# Patient Record
Sex: Female | Born: 1995 | Race: Asian | Hispanic: No | Marital: Married | Smoking: Never smoker
Health system: Southern US, Community
[De-identification: ages and names within clinical notes are randomized; demographics above are authoritative.]

---

## 2020-04-26 ENCOUNTER — Encounter: Payer: Self-pay | Admitting: Obstetrics & Gynecology

## 2020-04-26 ENCOUNTER — Other Ambulatory Visit (HOSPITAL_COMMUNITY)
Admission: RE | Admit: 2020-04-26 | Discharge: 2020-04-26 | Disposition: A | Discharge status: Home / Self Care | Payer: Medicaid Other | Source: Ambulatory Visit | Attending: Obstetrics & Gynecology | Admitting: Obstetrics & Gynecology

## 2020-04-26 ENCOUNTER — Other Ambulatory Visit: Payer: Self-pay

## 2020-04-26 ENCOUNTER — Ambulatory Visit (INDEPENDENT_AMBULATORY_CARE_PROVIDER_SITE_OTHER): Payer: Self-pay | Admitting: Obstetrics & Gynecology

## 2020-04-26 VITALS — BP 107/60 | HR 85 | Ht 62.99 in | Wt 112.9 lb

## 2020-04-26 DIAGNOSIS — Z3A33 33 weeks gestation of pregnancy: Secondary | ICD-10-CM

## 2020-04-26 DIAGNOSIS — O0990 Supervision of high risk pregnancy, unspecified, unspecified trimester: Secondary | ICD-10-CM | POA: Insufficient documentation

## 2020-04-26 DIAGNOSIS — O34219 Maternal care for unspecified type scar from previous cesarean delivery: Secondary | ICD-10-CM

## 2020-04-26 DIAGNOSIS — Z3493 Encounter for supervision of normal pregnancy, unspecified, third trimester: Secondary | ICD-10-CM | POA: Insufficient documentation

## 2020-04-26 DIAGNOSIS — Z0289 Encounter for other administrative examinations: Secondary | ICD-10-CM | POA: Insufficient documentation

## 2020-04-26 NOTE — Patient Instructions (Addendum)
Return to office for any scheduled appointments. Call the office or go to the MAU at [facility] if:  You begin to have strong, frequent contractions  Your water breaks.  Sometimes it is a big gush of fluid, sometimes it is just a trickle that keeps getting your panties wet or running down your legs  You have vaginal bleeding.  It is normal to have a small amount of spotting if your cervix was checked.   You do not feel your baby moving like normal.  If you do not, get something to eat and drink and lay down and focus on feeling your baby move.   If your baby is still not moving like normal, you should call the office or go to MAU.  Any other obstetric concerns.   Third Trimester of Pregnancy The third trimester is from week 28 through week 40 (months 7 through 9). The third trimester is a time when the unborn baby (fetus) is growing rapidly. At the end of the ninth month, the fetus is about 20 inches in length and weighs 6-10 pounds. Body changes during your third trimester Your body will continue to go through many changes during pregnancy. The changes vary from woman to woman. During the third trimester:  Your weight will continue to increase. You can expect to gain 25-35 pounds (11-16 kg) by the end of the pregnancy.  You may begin to get stretch marks on your hips, abdomen, and breasts.  You may urinate more often because the fetus is moving lower into your pelvis and pressing on your bladder.  You may develop or continue to have heartburn. This is caused by increased hormones that slow down muscles in the digestive tract.  You may develop or continue to have constipation because increased hormones slow digestion and cause the muscles that push waste through your intestines to relax.  You may develop hemorrhoids. These are swollen veins (varicose veins) in the rectum that can itch or be painful.  You may develop swollen, bulging veins (varicose veins)  in your legs.  You may have increased body aches in the pelvis, back, or thighs. This is due to weight gain and increased hormones that are relaxing your joints.  You may have changes in your hair. These can include thickening of your hair, rapid growth, and changes in texture. Some women also have hair loss during or after pregnancy, or hair that feels dry or thin. Your hair will most likely return to normal after your baby is born.  Your breasts will continue to grow and they will continue to become tender. A yellow fluid (colostrum) may leak from your breasts. This is the first milk you are producing for your baby.  Your belly button may stick out.  You may notice more swelling in your hands, face, or ankles.  You may have increased tingling or numbness in your hands, arms, and legs. The skin on your belly may also feel numb.  You may feel short of breath because of your expanding uterus.  You may have more problems sleeping. This can be caused by the size of your belly, increased need to urinate, and an increase in your body's metabolism.  You may notice the fetus "dropping," or moving lower in your abdomen (lightening).  You may have increased vaginal discharge.  You may notice your joints feel loose and you may have pain around your pelvic bone. What to expect at prenatal visits You will have   prenatal exams every 2 weeks until week 36. Then you will have weekly prenatal exams. During a routine prenatal visit:  You will be weighed to make sure you and the baby are growing normally.  Your blood pressure will be taken.  Your abdomen will be measured to track your baby's growth.  The fetal heartbeat will be listened to.  Any test results from the previous visit will be discussed.  You may have a cervical check near your due date to see if your cervix has softened or thinned (effaced).  You will be tested for Group B streptococcus. This happens between 35 and 37 weeks. Your  health care provider may ask you:  What your birth plan is.  How you are feeling.  If you are feeling the baby move.  If you have had any abnormal symptoms, such as leaking fluid, bleeding, severe headaches, or abdominal cramping.  If you are using any tobacco products, including cigarettes, chewing tobacco, and electronic cigarettes.  If you have any questions. Other tests or screenings that may be performed during your third trimester include:  Blood tests that check for low iron levels (anemia).  Fetal testing to check the health, activity level, and growth of the fetus. Testing is done if you have certain medical conditions or if there are problems during the pregnancy.  Nonstress test (NST). This test checks the health of your baby to make sure there are no signs of problems, such as the baby not getting enough oxygen. During this test, a belt is placed around your belly. The baby is made to move, and its heart rate is monitored during movement. What is false labor? False labor is a condition in which you feel small, irregular tightenings of the muscles in the womb (contractions) that usually go away with rest, changing position, or drinking water. These are called Braxton Hicks contractions. Contractions may last for hours, days, or even weeks before true labor sets in. If contractions come at regular intervals, become more frequent, increase in intensity, or become painful, you should see your health care provider. What are the signs of labor?  Abdominal cramps.  Regular contractions that start at 10 minutes apart and become stronger and more frequent with time.  Contractions that start on the top of the uterus and spread down to the lower abdomen and back.  Increased pelvic pressure and dull back pain.  A watery or bloody mucus discharge that comes from the vagina.  Leaking of amniotic fluid. This is also known as your "water breaking." It could be a slow trickle or a gush.  Let your health care provider know if it has a color or strange odor. If you have any of these signs, call your health care provider right away, even if it is before your due date. Follow these instructions at home: Medicines  Follow your health care provider's instructions regarding medicine use. Specific medicines may be either safe or unsafe to take during pregnancy.  Take a prenatal vitamin that contains at least 600 micrograms (mcg) of folic acid.  If you develop constipation, try taking a stool softener if your health care provider approves. Eating and drinking   Eat a balanced diet that includes fresh fruits and vegetables, whole grains, good sources of protein such as meat, eggs, or tofu, and low-fat dairy. Your health care provider will help you determine the amount of weight gain that is right for you.  Avoid raw meat and uncooked cheese. These carry germs that   can cause birth defects in the baby.  If you have low calcium intake from food, talk to your health care provider about whether you should take a daily calcium supplement.  Eat four or five small meals rather than three large meals a day.  Limit foods that are high in fat and processed sugars, such as fried and sweet foods.  To prevent constipation: ? Drink enough fluid to keep your urine clear or pale yellow. ? Eat foods that are high in fiber, such as fresh fruits and vegetables, whole grains, and beans. Activity  Exercise only as directed by your health care provider. Most women can continue their usual exercise routine during pregnancy. Try to exercise for 30 minutes at least 5 days a week. Stop exercising if you experience uterine contractions.  Avoid heavy lifting.  Do not exercise in extreme heat or humidity, or at high altitudes.  Wear low-heel, comfortable shoes.  Practice good posture.  You may continue to have sex unless your health care provider tells you otherwise. Relieving pain and  discomfort  Take frequent breaks and rest with your legs elevated if you have leg cramps or low back pain.  Take warm sitz baths to soothe any pain or discomfort caused by hemorrhoids. Use hemorrhoid cream if your health care provider approves.  Wear a good support bra to prevent discomfort from breast tenderness.  If you develop varicose veins: ? Wear support pantyhose or compression stockings as told by your healthcare provider. ? Elevate your feet for 15 minutes, 3-4 times a day. Prenatal care  Write down your questions. Take them to your prenatal visits.  Keep all your prenatal visits as told by your health care provider. This is important. Safety  Wear your seat belt at all times when driving.  Make a list of emergency phone numbers, including numbers for family, friends, the hospital, and police and fire departments. General instructions  Avoid cat litter boxes and soil used by cats. These carry germs that can cause birth defects in the baby. If you have a cat, ask someone to clean the litter box for you.  Do not travel far distances unless it is absolutely necessary and only with the approval of your health care provider.  Do not use hot tubs, steam rooms, or saunas.  Do not drink alcohol.  Do not use any products that contain nicotine or tobacco, such as cigarettes and e-cigarettes. If you need help quitting, ask your health care provider.  Do not use any medicinal herbs or unprescribed drugs. These chemicals affect the formation and growth of the baby.  Do not douche or use tampons or scented sanitary pads.  Do not cross your legs for long periods of time.  To prepare for the arrival of your baby: ? Take prenatal classes to understand, practice, and ask questions about labor and delivery. ? Make a trial run to the hospital. ? Visit the hospital and tour the maternity area. ? Arrange for maternity or paternity leave through employers. ? Arrange for family and  friends to take care of pets while you are in the hospital. ? Purchase a rear-facing car seat and make sure you know how to install it in your car. ? Pack your hospital bag. ? Prepare the baby's nursery. Make sure to remove all pillows and stuffed animals from the baby's crib to prevent suffocation.  Visit your dentist if you have not gone during your pregnancy. Use a soft toothbrush to brush your teeth and be   gentle when you floss. Contact a health care provider if:  You are unsure if you are in labor or if your water has broken.  You become dizzy.  You have mild pelvic cramps, pelvic pressure, or nagging pain in your abdominal area.  You have lower back pain.  You have persistent nausea, vomiting, or diarrhea.  You have an unusual or bad smelling vaginal discharge.  You have pain when you urinate. Get help right away if:  Your water breaks before 37 weeks.  You have regular contractions less than 5 minutes apart before 37 weeks.  You have a fever.  You are leaking fluid from your vagina.  You have spotting or bleeding from your vagina.  You have severe abdominal pain or cramping.  You have rapid weight loss or weight gain.  You have shortness of breath with chest pain.  You notice sudden or extreme swelling of your face, hands, ankles, feet, or legs.  Your baby makes fewer than 10 movements in 2 hours.  You have severe headaches that do not go away when you take medicine.  You have vision changes. Summary  The third trimester is from week 28 through week 40, months 7 through 9. The third trimester is a time when the unborn baby (fetus) is growing rapidly.  During the third trimester, your discomfort may increase as you and your baby continue to gain weight. You may have abdominal, leg, and back pain, sleeping problems, and an increased need to urinate.  During the third trimester your breasts will keep growing and they will continue to become tender. A yellow  fluid (colostrum) may leak from your breasts. This is the first milk you are producing for your baby.  False labor is a condition in which you feel small, irregular tightenings of the muscles in the womb (contractions) that eventually go away. These are called Braxton Hicks contractions. Contractions may last for hours, days, or even weeks before true labor sets in.  Signs of labor can include: abdominal cramps; regular contractions that start at 10 minutes apart and become stronger and more frequent with time; watery or bloody mucus discharge that comes from the vagina; increased pelvic pressure and dull back pain; and leaking of amniotic fluid. This information is not intended to replace advice given to you by your health care provider. Make sure you discuss any questions you have with your health care provider. Document Revised: 09/22/2018 Document Reviewed: 07/07/2016 Elsevier Patient Education  2020 Elsevier Inc.   Vaginal Birth After Cesarean Delivery  Vaginal birth after cesarean delivery (VBAC) is giving birth vaginally after previously delivering a baby through a cesarean section (C-section). A VBAC may be a safe option for you, depending on your health and other factors. It is important to discuss VBAC with your health care provider early in your pregnancy so you can understand the risks, benefits, and options. Having these discussions early will give you time to make your birth plan. Who are the best candidates for VBAC? The best candidates for VBAC are women who:  Have had one or two prior cesarean deliveries, and the incision made during the delivery was horizontal (low transverse).  Do not have a vertical (classical) scar on their uterus.  Have not had a tear in the wall of their uterus (uterine rupture).  Plan to have more pregnancies. A VBAC is also more likely to be successful:  In women who have previously given birth vaginally.  When labor starts by itself  (spontaneously)   before the due date. What are the benefits of VBAC? The benefits of delivering your baby vaginally instead of by a cesarean delivery include:  A shorter hospital stay.  A faster recovery time.  Less pain.  Avoiding risks associated with major surgery, such as infection and blood clots.  Less blood loss and less need for donated blood (transfusions). What are the risks of VBAC? The main risk of attempting a VBAC is that it may fail, forcing your health care provider to deliver your baby by a C-section. Other risks are rare and include:  Tearing (rupture) of the scar from a past cesarean delivery.  Other risks associated with vaginal deliveries. If a repeat cesarean delivery is needed, the risks include:  Blood loss.  Infection.  Blood clot.  Damage to surrounding organs.  Removal of the uterus (hysterectomy), if it is damaged.  Placenta problems in future pregnancies. What else should I know about my options? Delivering a baby through a VBAC is similar to having a normal spontaneous vaginal delivery. Therefore, it is safe:  To try with twins.  For your health care provider to try to turn the baby from a breech position (external cephalic version) during labor.  With epidural analgesia for pain relief. Consider where you would like to deliver your baby. VBAC should be attempted in facilities where an emergency cesarean delivery can be performed. VBAC is not recommended for home births. Any changes in your health or your baby's health during your pregnancy may make it necessary to change your initial decision about VBAC. Your health care provider may recommend that you do not attempt a VBAC if:  Your baby's suspected weight is 8.8 lb (4 kg) or more.  You have preeclampsia. This is a condition that causes high blood pressure along with other symptoms, such as swelling and headaches.  You will have VBAC less than 19 months after your cesarean  delivery.  You are past your due date.  You need to have labor started (induced) because your cervix is not ready for labor (unfavorable). Where to find more information  American Pregnancy Association: americanpregnancy.org  [employer]: acog.org Summary  Vaginal birth after cesarean delivery (VBAC) is giving birth vaginally after previously delivering a baby through a cesarean section (C-section). A VBAC may be a safe option for you, depending on your health and other factors.  Discuss VBAC with your health care provider early in your pregnancy so you can understand the risks, benefits, options, and have plenty of time to make your birth plan.  The main risk of attempting a VBAC is that it may fail, forcing your health care provider to deliver your baby by a C-section. Other risks are rare. This information is not intended to replace advice given to you by your health care provider. Make sure you discuss any questions you have with your health care provider. Document Revised: 09/27/2018 Document Reviewed: 09/08/2016 Elsevier Patient Education  2020 [employer].

## 2020-04-26 NOTE — Progress Notes (Signed)
History:   Katriona Schmierer is a 24 y.o. G2P1001 at [redacted]w[redacted]d by limited ultrasound dating being seen today for her first obstetrical visit here in the [place].  She is a refugee from [place], arrived last month. 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 No care in [place].  [place] interpreter used for encounter. She does remember she had a cesarean section, unknown reason, for her delivery in 2019. She wants to try to have a vaginal delivery this time.  Denied any medical history.  Throughout encounter, patient was very shy and could not meet my eyes. Did not want to be examined. Patient does intend to breast feed. Pregnancy history fully reviewed. [place], limited ultrasound was done that gave her this dating and EDD of 06/13/20 with limited biometry. She also received the COVID and Tdap vaccines there.  Patient wants female providers only, her appointment had to be switched to me from another female colleague.  Patient reports no complaints.      HISTORY: OB History  Gravida Para Term Preterm AB Living  2 1 1  0 0 1  SAB TAB Ectopic Multiple Live Births  0 0 0 0 1    # Outcome Date GA Lbr Len/2nd Weight Sex Delivery Anes PTL Lv  2 Current           1 Term 2019     CS-LTranv      History reviewed. No pertinent past medical history. History reviewed. No pertinent surgical history. History reviewed. No pertinent family history.   Social History   Tobacco Use  . Smoking status: Never Smoker  . Smokeless tobacco: Never Used  Vaping Use  . Vaping Use: Never used  Substance Use Topics  . Alcohol use: Never  . Drug use: Never   No Known Allergies No current outpatient medications on file prior to visit.   No current facility-administered medications on file prior to visit.    Review of Systems Pertinent items noted in HPI and remainder of comprehensive ROS otherwise negative. Physical Exam:   Vitals:   04/26/20 0846 04/26/20 0857  BP:  107/60   Pulse: 85   Weight: 112 lb 14.4 oz (51.2 kg)   Height:  5' 2.99" (1.6 m)   Fetal Heart Rate (bpm): 142 bpm  General: well-developed, well-nourished female in no acute distress  Breasts:  normal appearance, no masses or tenderness bilaterally  Skin: normal coloration and turgor, no rashes  Neurologic: oriented, normal, negative, normal mood  Extremities: normal strength, tone, and muscle mass, ROM of all joints is normal  HEENT PERRLA, extraocular movement intact and sclera clear, anicteric  Neck supple and no masses  Cardiovascular: regular rate noted  Respiratory:  no respiratory distress, normal breath sounds  Abdomen: Gravid, nontender on RN exam during FHR check.     Assessment:    Pregnancy: G2P1001 Patient Active Problem List   Diagnosis Date Noted  . Supervision of low-risk pregnancy, third trimester 04/26/2020  . Previous cesarean delivery, antepartum 04/26/2020  . Refugee status 04/26/2020     Plan:    1. Previous cesarean delivery, antepartum Counseled extensively regarding TOLAC vs RCS; risks/benefits discussed in detail. All questions answered.  Patient elects for TOLAC, consent signed 04/26/2020. Since she is unable to write, this was signed by interpreter and witnessed by second provider (Dr. 13/05/2020).  2. Refugee status Language, cultural and educational barrier. She also desires female providers.  Discussed with patient that this is a teaching  facility and that during their pregnancy there may be female physicians and other healthcare providers involved in their care. This includes, but is not limited to, prenatal visits and ultrasound examinations, as well as, the labor and delivery process and postpartum care. Also discussed with patient that they do have the right to transfer their care to another practice in the event that they do not agree or wish to see female providers under any situation. Informed patient that we will make every attempt to have a female  provider care for them, though this cannot be guaranteed, such as in an emergent situation. Also, reminded patient that when they are scheduling their appointments, to request a female provider each time and we will try to accommodate their request.   3. [redacted] weeks gestation of pregnancy 4. Encounter for supervision of low-risk pregnancy in third trimester - CBC/D/Plt+RPR+Rh+ABO+Rub Ab... - Hemoglobin A1c - GC/Chlamydia probe amp ([place])not at [facility] - Culture, OB Urine - Comprehensive metabolic panel - [place] MFM OB COMP + 14 WK; Future Initial labs drawn.  Declines prenatal vitamins. Problem list reviewed and updated. Ultrasound discussed; fetal anatomic survey: ordered. Anticipatory guidance about prenatal visits given including labs, ultrasounds, and testing. The nature of [place] [facility] with multiple MDs and Advanced Practice Providers was explained to patient; also emphasized that residents, students are part of our team. Routine obstetric precautions reviewed. Encouraged to seek out care at office or emergency room [facility] MAU preferred) for urgent and/or emergent concerns. Return in about 1 week (around 05/03/2020) for 2 hr GTT   2 weeks:  OFFICE OB Visit (Low Risk).     Jaynie Collins, MD, FACOG Obstetrician & Gynecologist, [employer] [facility] [employer], [facility]

## 2020-04-28 LAB — URINE CULTURE, OB REFLEX

## 2020-04-28 LAB — CULTURE, OB URINE

## 2020-04-29 DIAGNOSIS — Z3493 Encounter for supervision of normal pregnancy, unspecified, third trimester: Secondary | ICD-10-CM

## 2020-04-29 LAB — COMPREHENSIVE METABOLIC PANEL
ALT: 22 IU/L (ref 0–32)
AST: 20 IU/L (ref 0–40)
Albumin/Globulin Ratio: 1.3 (ref 1.2–2.2)
Albumin: 3.7 g/dL — ABNORMAL LOW (ref 3.9–5.0)
Alkaline Phosphatase: 135 IU/L — ABNORMAL HIGH (ref 44–121)
BUN/Creatinine Ratio: 13 (ref 9–23)
BUN: 7 mg/dL (ref 6–20)
Bilirubin Total: <0.2 mg/dL (ref 0.0–1.2)
CO2: 18 mmol/L — ABNORMAL LOW (ref 20–29)
Calcium: 8.6 mg/dL — ABNORMAL LOW (ref 8.7–10.2)
Chloride: 106 mmol/L (ref 96–106)
Creatinine, Ser: 0.52 mg/dL — ABNORMAL LOW (ref 0.57–1.00)
GFR calc Af Amer: 152 mL/min/{1.73_m2} (ref >59)
GFR calc non Af Amer: 132 mL/min/{1.73_m2} (ref >59)
Globulin, Total: 2.8 g/dL (ref 1.5–4.5)
Glucose: 78 mg/dL (ref 65–99)
Potassium: 4.2 mmol/L (ref 3.5–5.2)
Sodium: 138 mmol/L (ref 134–144)
Total Protein: 6.5 g/dL (ref 6.0–8.5)

## 2020-04-29 LAB — CBC/D/PLT+RPR+RH+ABO+RUB AB...
Antibody Screen: NEGATIVE
Basophils Absolute: 0 10*3/uL (ref 0.0–0.2)
Basos: 0 %
EOS (ABSOLUTE): 0.1 10*3/uL (ref 0.0–0.4)
Eos: 2 %
HCV Ab: <0.1 s/co ratio (ref 0.0–0.9)
HIV Screen 4th Generation wRfx: NONREACTIVE
Hematocrit: 33.4 % — ABNORMAL LOW (ref 34.0–46.6)
Hemoglobin: 11.3 g/dL (ref 11.1–15.9)
Hepatitis B Surface Ag: NEGATIVE
Immature Grans (Abs): 0 10*3/uL (ref 0.0–0.1)
Immature Granulocytes: 0 %
Lymphocytes Absolute: 1.9 10*3/uL (ref 0.7–3.1)
Lymphs: 21 %
MCH: 30.1 pg (ref 26.6–33.0)
MCHC: 33.8 g/dL (ref 31.5–35.7)
MCV: 89 fL (ref 79–97)
Monocytes Absolute: 0.6 10*3/uL (ref 0.1–0.9)
Monocytes: 7 %
Neutrophils Absolute: 6.3 10*3/uL (ref 1.4–7.0)
Neutrophils: 70 %
Platelets: 288 10*3/uL (ref 150–450)
RBC: 3.75 x10E6/uL — ABNORMAL LOW (ref 3.77–5.28)
RDW: 12.2 % (ref 11.7–15.4)
RPR Ser Ql: NONREACTIVE
Rh Factor: POSITIVE
Rubella Antibodies, IGG: 4.85 index (ref >0.99)
WBC: 9 10*3/uL (ref 3.4–10.8)

## 2020-04-29 LAB — GC/CHLAMYDIA PROBE AMP (~~LOC~~) NOT AT ARMC
Chlamydia: NEGATIVE
Comment: NEGATIVE
Comment: NORMAL
Neisseria Gonorrhea: NEGATIVE

## 2020-04-29 LAB — HEMOGLOBIN A1C
Est. average glucose Bld gHb Est-mCnc: 82 mg/dL
Hgb A1c MFr Bld: 4.5 % — ABNORMAL LOW (ref 4.8–5.6)

## 2020-04-29 LAB — HCV INTERPRETATION

## 2020-05-01 ENCOUNTER — Encounter: Payer: Self-pay | Admitting: *Deleted

## 2020-05-03 ENCOUNTER — Other Ambulatory Visit (INDEPENDENT_AMBULATORY_CARE_PROVIDER_SITE_OTHER): Payer: Self-pay | Admitting: Medical

## 2020-05-03 ENCOUNTER — Other Ambulatory Visit: Payer: Self-pay

## 2020-05-03 ENCOUNTER — Encounter: Payer: Self-pay | Admitting: Medical

## 2020-05-03 DIAGNOSIS — Z3493 Encounter for supervision of normal pregnancy, unspecified, third trimester: Secondary | ICD-10-CM

## 2020-05-03 NOTE — Progress Notes (Signed)
   PRENATAL VISIT NOTE  Subjective:  Erika Humphrey is a 24 y.o. G2P1001 at [redacted]w[redacted]d being seen today for ongoing prenatal care.  She is currently monitored for the following issues for this low-risk pregnancy and has Supervision of low-risk pregnancy, third trimester; Previous cesarean delivery, antepartum; and Refugee status on their problem list.  Patient reports occasional contractions.   .  .   . Denies leaking of fluid. Patient complains of intermittent pain that occurs 1-2x/day. She states this is "normal" pain. She denies vaginal bleeding, discharge, LOF. She reports normal fetal movement.   The following portions of the patient's history were reviewed and updated as appropriate: allergies, current medications, past family history, past medical history, past social history, past surgical history and problem list.   Objective:   Vitals:   05/03/20 0940  BP: (!) 84/47  Pulse: 77    Fetal Status:          FHR 136-142 bpm  General:  Alert, oriented and cooperative. Patient is in no acute distress.  Skin: Skin is warm and dry. No rash noted.   Cardiovascular: Normal heart rate noted  Respiratory: Normal respiratory effort, no problems with respiration noted  Abdomen: Soft, gravid, appropriate for gestational age.        Pelvic: Cervical exam deferred        Extremities: Normal range of motion.     Mental Status: Normal mood and affect. Normal behavior. Normal judgment and thought content.   Assessment and Plan:  Pregnancy: G2P1001 at [redacted]w[redacted]d 1. Supervision of low-risk pregnancy, third trimester - Glucose Tolerance, 2 Hours w/1 Hour - patient declined glucose testing   Preterm labor symptoms and general obstetric precautions including but not limited to vaginal bleeding, contractions, leaking of fluid and fetal movement were reviewed in detail with the patient. Please refer to After Visit Summary for other counseling recommendations.   Return for As scheduled.  Future Appointments   Date Time Provider Department Center  05/07/2020 10:35 AM Alric Seton, MD [facility]  05/14/2020 12:45 PM [facility] US[facility]    Vonzella Nipple, PA-C

## 2020-05-07 ENCOUNTER — Telehealth: Payer: Self-pay | Admitting: Family Medicine

## 2020-05-07 ENCOUNTER — Encounter: Payer: Self-pay | Admitting: Family Medicine

## 2020-05-07 NOTE — Telephone Encounter (Signed)
Called number in patient chart, got no answer, was unable to leave a message.  Patient missed her appointment this morning.

## 2020-05-14 ENCOUNTER — Encounter: Payer: Self-pay | Admitting: Obstetrics & Gynecology

## 2020-05-14 ENCOUNTER — Ambulatory Visit: Payer: Medicaid Other | Attending: Obstetrics & Gynecology

## 2020-05-14 ENCOUNTER — Other Ambulatory Visit: Payer: Self-pay

## 2020-05-14 ENCOUNTER — Telehealth: Payer: Self-pay | Admitting: Family Medicine

## 2020-05-14 ENCOUNTER — Other Ambulatory Visit: Payer: Self-pay | Admitting: Obstetrics & Gynecology

## 2020-05-14 ENCOUNTER — Encounter: Payer: Self-pay | Admitting: Obstetrics and Gynecology

## 2020-05-14 DIAGNOSIS — Z3493 Encounter for supervision of normal pregnancy, unspecified, third trimester: Secondary | ICD-10-CM | POA: Diagnosis not present

## 2020-05-14 DIAGNOSIS — Z3A33 33 weeks gestation of pregnancy: Secondary | ICD-10-CM | POA: Diagnosis present

## 2020-05-14 DIAGNOSIS — O359XX0 Maternal care for (suspected) fetal abnormality and damage, unspecified, not applicable or unspecified: Secondary | ICD-10-CM | POA: Insufficient documentation

## 2020-05-14 NOTE — Telephone Encounter (Signed)
Called and spoke to Case Worker Herbert Seta to let her know she di not keep her Dr. Astronomer today, said they would reach out and have them to contact the office

## 2020-05-21 ENCOUNTER — Ambulatory Visit (INDEPENDENT_AMBULATORY_CARE_PROVIDER_SITE_OTHER): Payer: Self-pay | Admitting: Nurse Practitioner

## 2020-05-21 ENCOUNTER — Other Ambulatory Visit: Payer: Self-pay

## 2020-05-21 VITALS — BP 104/54 | HR 78 | Wt 117.6 lb

## 2020-05-21 DIAGNOSIS — Z3493 Encounter for supervision of normal pregnancy, unspecified, third trimester: Secondary | ICD-10-CM

## 2020-05-21 DIAGNOSIS — Z3A36 36 weeks gestation of pregnancy: Secondary | ICD-10-CM

## 2020-05-21 DIAGNOSIS — O359XX0 Maternal care for (suspected) fetal abnormality and damage, unspecified, not applicable or unspecified: Secondary | ICD-10-CM

## 2020-05-21 NOTE — Patient Instructions (Signed)
AREA PEDIATRIC/FAMILY PRACTICE PHYSICIANS  [place] ([address]) . [facility] o Chambliss, MD; Eniola, MD; Hale, MD; Hensel, MD; McDiarmid, MD; McIntyer, MD; Neal, MD; Walden, MD o [address] [place], [place] [address] o [phone] o Mon-Fri 8:30-12:30, 1:30-5:00 o Providers come to see babies at [facility] o Accepting Medicaid . [facility] o Limited providers who accept newborns: Koirala, MD; Morrow, MD; Wolters, MD o [address], [place], [place] [address] o [phone] o Mon-Fri 8:00-5:30 o Babies seen by providers at [facility] o Does NOT accept Medicaid o Please call early in hospitalization for appointment (limited availability)  . [facility] o Mulberry, MD o [address] [place], [place] [address] o [phone] o Mon, Tue, Thur, Fri 8:30-5:00, Wed 10:00-7:00 (closed 1-2pm) o Babies seen by [facility] providers o Accepting Medicaid . [facility] - Pediatrician o Rubin, MD o [address], [place], [place] [address] o [phone] o Mon-Fri 8:30-5:00, Sat 8:30-12:00 o Provider comes to see babies at [facility] o Accepting Medicaid o Must have been referred from current patients or contacted office prior to delivery . [facility] ([facility]) o Brown, MD; Chandler, MD; Ettefagh, MD; Grant, MD; Lester, MD; McCormick, MD; McQueen, MD; Prose, MD; Simha, MD; Stanley, MD; Stryffeler, NP; Tebben, NP o [address], [place], [place] [address] o [phone] o Mon, Tue, Thur, Fri 8:30-5:30, Wed 9:30-5:30, Sat 8:30-12:30 o Babies seen by [facility] providers o Accepting Medicaid o Only accepting infants of first-time parents or siblings of current patients o Hospital discharge coordinator will make follow-up appointment . Jack Amos o [address],  [place], [place]  [address] o [phone]   Fax - [phone] . [facility] o [address], [place], [place]  [address] o Phone - [phone]   Fax - [phone] . Shilpa Gosrani o [address], [place], [place]  [address] o [phone]  [place] ([address]) . [facility] o Bates, MD; Brassfield, MD; Cooper, Cox, MD; MD; Davis, MD; Dovico, MD; Ettefaugh, MD; Little, MD; Lowe, MD; Keiffer, MD; Melvin, MD; Sumner, MD; Williams, MD o [address], [place], [place] [address] o [phone] o Mon-Fri 8:30-5:00 (extended evenings Mon-Thur as needed), Sat-Sun 10:00-1:00 o Providers come to see babies at [facility] o Accepting Medicaid for families of first-time babies and families with all children in the household age 3 and under. Must register with office prior to making appointment (M-F only). . [facility] o Henson, NP; Knapp, MD; Lalonde, MD; Tysinger, PA o [address] [place], [place] [address] o [phone] o Mon-Fri 8:00-5:00 o Babies seen by providers at [facility] o Does NOT accept Medicaid/Commercial Insurance Only . [facility] ([facility]  o Artis, MD; Barnes, MD; Bratton, MD; Coccaro, MD; [place] Gardner, MD; Kramer, MD; Marshall, MD; Netherton, MD; Poleto, MD; Skinner, MD o [address] [place], [place] [address] o [phone] o Mon-Fri 8:30-5:30, Sat (Oct.-Mar.) 9:00-1:00 o Babies seen by providers at [facility] o Accepting Medicaid  [place] ([address]) . [facility] o Reid, MD; Warner, MD o [address], [place],  [address] o [phone] o Mon-Fri 8:30-5:00, Sat 8:30-12:00 o Providers come to see babies at [facility] o Does NOT accept Medicaid . [facility] o Becker, PA; Hagler, MD; Scifres, PA; Sun, MD; Swayne, MD o [address],  [place], [place] [address] o [phone] o Mon-Fri 8:00-5:00 o Babies seen by providers at [facility] o Does NOT accept Medicaid o Only accepting babies of parents who are patients o Please call early in hospitalization for appointment (limited availability) . [facility] o Clark, MD; Frye, MD; Kelleher, MD; Mack, NP; Miller, MD; O'Keller, MD; Patterson, NP; Pudlo, MD; Puzio, MD; Thomas, MD; Tucker, MD; Twiselton, MD o [address], [place], [place] [address] o [phone] o Mon-Fri 8:00-5:00, Sat 9:00-12:00 o Providers come to see babies at [facility] o Does NOT accept Medicaid  [place] ([address]) . [facility] o Limited providers accepting new patients: Brake, NP; Wharton, PA o [address], [place], [place] [address] o [phone] o Mon-Fri 8:00-5:00 o Babies seen by providers at [facility] o Does NOT accept Medicaid o Only accepting babies of parents who are patients o Please call early in hospitalization for appointment (limited availability) . [facility] o Gay, MD; Quinlan, MD o [address] [place], [place] [address] o [phone] (press 1 to schedule appointment) o Mon-Fri 8:00-5:00 o Providers come to see babies at [facility] o Does NOT accept Medicaid . [facility] o Mazer, MD o [address] [place], [place] [address] o [phone] o Mon-Fri 8:30-5:00 (lunch 12:30-1:00), extended hours by appointment only Wed 5:00-6:30 o Babies seen by [facility] providers o Accepting Medicaid . [facility] o Banks, MD; Jordan, MD; Koberlein, MD o [address], [place], [place] [address] o [phone] o Mon-Fri 8:00-5:00 o Babies seen by [facility] providers o Does NOT accept Medicaid . [facility] o Parker, MD; Hunter, MD; Wallace, DO o [address] [place], [place]  [address] o [phone] o Mon-Fri 8:00-5:00 o Babies seen by [facility] providers o Does NOT accept Medicaid . [facility] o Brandon, PA; Brecken, PA; Christy, NP; Dees, MD; DeClaire, MD; DeWeese, MD; Hansen, NP; Mills, NP; Parrish, NP; Smoot, NP; Summer, MD; Vapne, MD o [address] [place], [place] [address] o [phone] o Mon-Fri 8:30-5:00, Sat 10:00-1:00 o Providers come to see babies at [facility] o Does NOT accept Medicaid o Free prenatal information session Tuesdays at 4:45pm . [facility] o Bouska, MD; Gordon, PA; Jeffery, PA; Weber, PA o [address] [place] [address] o [phone] o Mon-Fri 7:30-5:30 o Babies seen by [facility] providers . [facility] o [address], [place], [place]  [address] o [phone]   Fax - [phone]  [place] ([address] & [address]) . [facility] o [place], [place] o [address] [place], [place] [address] o [phone] o Mon-Thur 8:00-6:00 o Providers come to see babies at [facility] o Accepting Medicaid . [facility] o Anderson, NP; Badger, MD; Beal, PA; Spencer, PA o [address] [place], [place] [address] o [phone] o Mon-Thur 7:30-7:30, Fri 7:30-4:30 o Babies seen by [facility] providers o Accepting Medicaid . [facility] o Agbuya, MD; Klett, NP; Romgoolam, MD o [address], [place],  [address] o [phone] o Mon-Fri 8:30-5:00, Sat 8:30-12:00 o Providers come to see babies at [facility] o Accepting Medicaid o Must have "Meet & Greet" appointment at office prior to delivery . [facility] o McCord,   MD; Wallace, MD; Wood, MD o [address], [place], [place] [address] o [phone] o Mon-Wed 8:00-6:00, Thur-Fri 8:00-5:00, Sat 9:00-12:00 o Providers come to  see babies at [facility] o Does NOT accept Medicaid o Only accepting siblings of current patients . [facility]  o [address], [place], [place]  [address] o [phone]   Fax - [phone] . [facility] o [address], [place], [place]  [address] o [phone]   Fax - [phone]  [place] ([address] & [address]) . [facility] o Cirigliano, DO; Matthews, DO o [address] [place], [place] [address] o [phone] o Mon-Fri 7:00-5:00 o Babies seen by [facility] providers o Does NOT accept Medicaid . [facility] o Briscoe, MD; Howley, PA; Moreira, PA o [address], [place], [place] [address] o [phone] o Mon-Fri 8:00-5:00 o Babies seen by [facility] providers o Accepting Medicaid . [facility] o Boyd, MD; Church, PA; Jones, NP; Osborn, PA o [address], [place], [place] [address] o [phone] o Mon-Fri 8:00-5:00 o Babies seen by providers at [facility] o Accepting Medicaid  [place] ([address]) . [facility] [place] o Wendling, DO o [address] [place], [place] [address] o [phone] o Mon-Fri 8:00-5:00 o Babies seen by [facility] providers o Does NOT accept Medicaid o Limited availability, please call early in hospitalization to schedule follow-up . [facility] o Calderon, PA; Cummings, MD; Dillard, MD; Martin, PA; Olson, MD; VanDeven, PA o [address], [place], [place] [address] o [phone] o Mon-Fri 8:30-5:00, Sat 9:00-12:00 o Babies seen by providers at [facility] o Accepting Medicaid o Please register online then schedule online or call office o [email or web address] . [facility] ([facility] o Hunter, NP; Kumar, MD; [place] Rogers, PA o [address], [place], [place] [address] o [phone] o Mon-Fri 8:00-5:00 o Babies seen by providers at [facility] o Accepting Medicaid . [facility] ([facility]) o [place], MD; Kristi Fleenor, NP; West, MD o [address], [place], [place] [address] o [phone] o Mon-Fri 8:00-5:30, Sat&Sun by appointment (phones open at 8:30) o Babies seen by [facility] providers o Accepting Medicaid o Must be a first-time baby or sibling of current patient . [facility] [place]  o [address], [place], [place]  [address] o [phone]   Fax - [phone]  [place] ([address]) . [facility] o Brown, PA; Cowen, PA; Rice, MD; Helton, PA; Spry, MD o [address] [place], [place] [address] o [phone] o Mon-Thur 8:00-7:00, Fri 8:00-5:00, Sat 8:00-12:00, Sun 9:00-12:00 o Babies seen by [facility] providers o Accepting Medicaid . [facility] o Coe-Goins, MD; Marshall, MD; Pierre-Louis, MD o [address] [place] [address], [place], [place] [address] o [phone] o Mon-Thur 8:00-5:00 o Babies seen by providers at [facility] o Accepting Medicaid . [facility] o Bratton, MD; Coe-Goins, MD; Hayes, MD; Lewis, MD; List, MD; Lott, MD; Marshall, MD; Moran, MD; O'Neal, MD; Pierre-Louis, MD; Pitonzo, MD; Scholer, MD; Spangle, MD o [address] [place], [place]   [address] o [phone] o Mon-Fri 8:00-5:30, Sat (Oct.-Mar.) 9:00-1:00 o Babies seen by providers at [facility] o Accepting Medicaid o Must fill out new patient packet, available online at [email or web address] . [facility] ([facility] o Friddle, NP; Harris, NP; Kelly, NP; Logan, MD;  Melvin, PA; Poth, MD; Ramadoss, MD; Stanton, NP o [address], [place], [place] [address] o [phone] o Mon-Thur 8:00-5:30, Fri 8:00-5:00 o Babies seen by providers at [facility] o Accepting Medicaid  [place] ([address]) . [facility] o Dixon, PA; [place], MD; Pickard, MD; Tapia, PA o [address], [place], [place] [address] o [phone] o Mon-Fri 8:00-5:00 o Babies seen by providers at [facility] o Accepting Medicaid   [place] ([address]) . [facility] [place] o Masneri, DO; Meyers, MD; Nelson, PA o [address], [place], [place] [address] o [phone] o Mon-Fri 8:00-5:00 o Babies seen by providers at [facility] o Does NOT accept Medicaid o Limited appointment availability, please call early in hospitalization  . [facility] [place] o Kunedd, DO; McGowen, MD o [address], [place], [place] [address] o [phone] o Mon-Fri 8:00-5:00 o Babies seen by [facility] providers o Does NOT accept Medicaid . [facility] [place] o Cameron, MD; MacDonald, MD; Michaels, PA; Nayak, MD o [address] [place] [address], [place], [place] [address] o [phone] o Mon-Fri 8:00-5:00 o After hours clinic ([address] [place], [place] [address]) [phone] Mon-Fri 5:00-8:00, Sat 12:00-6:00, Sun 10:00-4:00 o Babies seen by [facility] providers o Accepting Medicaid . [facility] [place] o [address], [place], [place]  [address] o [phone]   Fax - [phone]  [place] ([address]) . [facility] [place] [facility] o Andy, MD o [address], [place], [place] [address] o [phone] o Mon-Fri 8:00-5:00 o Babies seen by [facility] providers o Does NOT accept Medicaid . [facility] [place] ([facility] [place]) o Eksir, MD o [address], [place],   [address] o [phone] o Mon-Thur 8:00-7:00, Fri 8:00-5:00, Sat 8:00-12:00 o Babies seen by providers at [facility] o Accepting Medicaid - but does not have vaccinations in office (must be received elsewhere) o Limited availability, please call early in hospitalization  [place] ([address]) . [facility]  o Charlene Flemming, MD o [address], [place]  [address] o [phone]  Fax [phone]   

## 2020-05-21 NOTE — Progress Notes (Signed)
    Subjective:  Erika Humphrey is a 24 y.o. G2P1001 at [redacted]w[redacted]d being seen today for ongoing prenatal care.  She is currently monitored for the following issues for this low-risk pregnancy and has Supervision of low-risk pregnancy, third trimester; Previous cesarean delivery, antepartum; Refugee status; and Fetal left hydronephrosis (62mm) noted on ultrasound, female fetus on their problem list.  Patient reports no complaints.  Contractions: Not present. Vag. Bleeding: None.  Movement: Present. Denies leaking of fluid.   The following portions of the patient's history were reviewed and updated as appropriate: allergies, current medications, past family history, past medical history, past social history, past surgical history and problem list. Problem list updated.  Objective:   Vitals:   05/21/20 1055  BP: (!) 104/54  Pulse: 78  Weight: 117 lb 9.6 oz (53.3 kg)    Fetal Status: Fetal Heart Rate (bpm): 146 Fundal Height: 31 cm Movement: Present     General:  Alert, oriented and cooperative. Patient is in no acute distress.  Skin: Skin is warm and dry. No rash noted.   Cardiovascular: Normal heart rate noted  Respiratory: Normal respiratory effort, no problems with respiration noted  Abdomen: Soft, gravid, appropriate for gestational age. Pain/Pressure: Absent     Pelvic:  Cervical exam deferred        Extremities: Normal range of motion.  Edema: None  Mental Status: Normal mood and affect. Normal behavior. Normal judgment and thought content.   Urinalysis:      Assessment and Plan:  Pregnancy: G2P1001 at [redacted]w[redacted]d  1. Supervision of low-risk pregnancy, third trimester Second visit in this pregnancy.  [place] refugee in [place] Declines vaginal swabs - not done in her country - even with explanation client is very timid, does not make eye contact, is very stressed about her situation in [place] with no family nearby.  Continues to have concerns to have a female provider.  Was upset that [place]  female MD discussed ultrasound with her.  - GC/Chlamydia probe amp ([place])not at [facility] - Culture, beta strep (group b only)  2,  Previous C/S Plans TOLAC  2. Fetal abnormality in antepartum pregnancy, single or unspecified fetus Did not realize this was a concern in her ultrasound. Reviewed the importance of evaluation after the baby is born. Given list of peds and asked to have care manager help her find MD for the baby.   Term labor symptoms and general obstetric precautions including but not limited to vaginal bleeding, contractions, leaking of fluid and fetal movement were reviewed in detail with the patient. Please refer to After Visit Summary for other counseling recommendations.  Return in about 1 week (around 05/28/2020) for in person ROB  - female provider please.  Nolene Bernheim, RN, MSN, NP-BC Nurse Practitioner, [facility] for [employer], [facility] 05/21/2020 11:24 AM

## 2020-05-21 NOTE — Progress Notes (Signed)
Pt was taken to [facility]-05/21/20

## 2020-05-28 ENCOUNTER — Ambulatory Visit (INDEPENDENT_AMBULATORY_CARE_PROVIDER_SITE_OTHER): Payer: Self-pay | Admitting: Obstetrics and Gynecology

## 2020-05-28 ENCOUNTER — Encounter: Payer: Self-pay | Admitting: Obstetrics and Gynecology

## 2020-05-28 ENCOUNTER — Other Ambulatory Visit: Payer: Self-pay

## 2020-05-28 VITALS — BP 107/70 | HR 77 | Wt 117.9 lb

## 2020-05-28 DIAGNOSIS — Z3493 Encounter for supervision of normal pregnancy, unspecified, third trimester: Secondary | ICD-10-CM

## 2020-05-28 DIAGNOSIS — O26843 Uterine size-date discrepancy, third trimester: Secondary | ICD-10-CM

## 2020-05-28 DIAGNOSIS — Z0289 Encounter for other administrative examinations: Secondary | ICD-10-CM

## 2020-05-28 DIAGNOSIS — O359XX0 Maternal care for (suspected) fetal abnormality and damage, unspecified, not applicable or unspecified: Secondary | ICD-10-CM

## 2020-05-28 DIAGNOSIS — Z3A37 37 weeks gestation of pregnancy: Secondary | ICD-10-CM

## 2020-05-28 DIAGNOSIS — O34219 Maternal care for unspecified type scar from previous cesarean delivery: Secondary | ICD-10-CM

## 2020-05-28 NOTE — Progress Notes (Signed)
PRENATAL VISIT NOTE  Subjective:  Erika Humphrey is a 23 y.o. G2P1001 at [redacted]w[redacted]d being seen today for ongoing prenatal care.  She is currently monitored for the following issues for this high-risk pregnancy and has Supervision of low-risk pregnancy, third trimester; Previous cesarean delivery, antepartum; Refugee status; Fetal left hydronephrosis (61mm) noted on ultrasound, female fetus; [redacted] weeks gestation of pregnancy; and Size of fetus inconsistent with dates in third trimester on their problem list.  Patient reports no complaints.  Contractions: Not present. Vag. Bleeding: None.  Movement: Present. Denies leaking of fluid.  The following portions of the patient's history were reviewed and updated as appropriate: allergies, current medications, past family history, past medical history, past social history, past surgical history and problem list.   Objective:   Vitals:   05/28/20 1132  BP: 107/70  Pulse: 77  Weight: 117 lb 14.4 oz (53.5 kg)    Fetal Status: Fetal Heart Rate (bpm): 138 Fundal Height: 32 cm Movement: Present  Presentation: Vertex  General:  Alert, oriented and cooperative. Patient is in no acute distress.  Skin: Skin is warm and dry. No rash noted.   Cardiovascular: Normal heart rate noted  Respiratory: Normal respiratory effort, no problems with respiration noted  Abdomen: Soft, gravid, appropriate for gestational age.  Pain/Pressure: Present  Low transverse remote surgical incision visible. Cephalic fetal presentation on bedside ultrasound.   Pelvic: Cervical exam deferred        Extremities: Normal range of motion.  Edema: None  Mental Status: Pt appears withdrawn with minimal eye contact and only brief responses. Denies safety concerns. Normal behavior. Normal judgment and thought content.   Assessment and Plan:  Pregnancy: G2P1001 at [redacted]w[redacted]d 1. [redacted] weeks gestation of pregnancy, Supervision of low-risk pregnancy, third trimester: Pt endorses +FM and +FHTs in clinic  today. No red flag symptoms. Pt with fundal height less than dates today but recent ultrasound at [redacted]w[redacted]d with EFW 16%. Cephalic on bedside ultrasound today. Of note, pt did not want to enter clinic for her appointment today. However, after conversation with her sponsor and interpreter she agreed to be evaluated. In conversation with interpreter it seems that the transition to the [place] with frequent medical visits for prenatal care has been overwhelming and difficult. - emphasized importance of daily prenatal vitamin (unclear if pt is currently taking) - pt continues to decline GBS testing today despite offer of self-collect specimen - discussed in depth reasons to present to the hospital and provided a map & address for [facility] in after visit instructions (pt's sponsor will also review with pt's husband given he is not present for the appointment today) - rtc in 1 week for f/u prenatal appt or sooner for return precautions as noted  2. Previous cesarean delivery, antepartum: Pt with history of LTCS x1 in [place]. She strongly desires TOLAC if possible. In conversation with pt through interpreter today, it seems that pt was hospitalized for several days and then had an emergent Cesarean for breech presentation. However, somewhat unclear given minimal information provided by patient. Abdominal exam with low transverse remote incision.  - TOLAC consent form signed on 04/26/20  3. Refugee status: Pt minimally engaged throughout office visit today. Emphasized our desire to provide safe care to patient and her unborn child. No report of safety or mood concerns today. However, high suspicion of adjustment disorder given pt's recent transition to the [place] as a refugee. - continue to monitor pt's mental status and have low threshold to consult BH at  f/u appt if concerns  4. Fetal abnormality in antepartum pregnancy, single or unspecified fetus: Fetal left hydronephrosis. Pt made aware at last office visit. Will ensure  peds is notified on admission to L&D.  5. Size of fetus inconsistent with dates in third Trimester: Fundal height continues to be less than dates today. Weight consistent with last appointment 1 week ago. Reassuringly, recent ultrasound at [redacted]w[redacted]d should EFW 16%. Would have low threshold to order growth ultrasound if patient still pregnant in 1 week with minimal change in fundal height to ensure no new IUGR.  Term labor symptoms and general obstetric precautions including but not limited to vaginal bleeding, contractions, leaking of fluid and fetal movement were reviewed in detail with the patient. Please refer to After Visit Summary for other counseling recommendations.  Return for patient already scheduled for f/u prenatal appointment in 1 week.  Future Appointments  Date Time Provider Department Center  06/05/2020 10:35 AM Alric Seton, MD [facility]  06/11/2020 10:35 AM Sheila Oats, MD [facility]  06/18/2020 10:35 AM Calvert Cantor, CNM [facility]   Sheila Oats, MD OB Fellow, Faculty Practice 05/29/2020 6:29 PM

## 2020-05-28 NOTE — Patient Instructions (Addendum)
[facility] at [facility] [address] [place],  [place]  [id number]  Third Trimester of Pregnancy The third trimester is from week 28 through week 40 (months 7 through 9). The third trimester is a time when the unborn baby (fetus) is growing rapidly. At the end of the ninth month, the fetus is about 20 inches in length and weighs 6-10 pounds. Body changes during your third trimester Your body will continue to go through many changes during pregnancy. The changes vary from woman to woman. During the third trimester:  Your weight will continue to increase. You can expect to gain 25-35 pounds (11-16 kg) by the end of the pregnancy.  You may begin to get stretch marks on your hips, abdomen, and breasts.  You may urinate more often because the fetus is moving lower into your pelvis and pressing on your bladder.  You may develop or continue to have heartburn. This is caused by increased hormones that slow down muscles in the digestive tract.  You may develop or continue to have constipation because increased hormones slow digestion and cause the muscles that push waste through your intestines to relax.  You may develop hemorrhoids. These are swollen veins (varicose veins) in the rectum that can itch or be painful.  You may develop swollen, bulging veins (varicose veins) in your legs.  You may have increased body aches in the pelvis, back, or thighs. This is due to weight gain and increased hormones that are relaxing your joints.  You may have changes in your hair. These can include thickening of your hair, rapid growth, and changes in texture. Some women also have hair loss during or after pregnancy, or hair that feels dry or thin. Your hair will most likely return to normal after your baby is born.  Your breasts will continue to grow and they will continue to become tender. A yellow fluid (colostrum) may leak from your breasts. This is  the first milk you are producing for your baby.  Your belly button may stick out.  You may notice more swelling in your hands, face, or ankles.  You may have increased tingling or numbness in your hands, arms, and legs. The skin on your belly may also feel numb.  You may feel short of breath because of your expanding uterus.  You may have more problems sleeping. This can be caused by the size of your belly, increased need to urinate, and an increase in your body's metabolism.  You may notice the fetus "dropping," or moving lower in your abdomen (lightening).  You may have increased vaginal discharge.  You may notice your joints feel loose and you may have pain around your pelvic bone. What to expect at prenatal visits You will have prenatal exams every 2 weeks until week 36. Then you will have weekly prenatal exams. During a routine prenatal visit:  You will be weighed to make sure you and the baby are growing normally.  Your blood pressure will be taken.  Your abdomen will be measured to track your baby's growth.  The fetal heartbeat will be listened to.  Any test results from the previous visit will be discussed.  You may have a cervical check near your due date to see if your cervix has softened or thinned (effaced).  You will be tested for Group B streptococcus. This happens between 35 and 37 weeks. Your health care provider may ask you:  What your  birth plan is.  How you are feeling.  If you are feeling the baby move.  If you have had any abnormal symptoms, such as leaking fluid, bleeding, severe headaches, or abdominal cramping.  If you are using any tobacco products, including cigarettes, chewing tobacco, and electronic cigarettes.  If you have any questions. Other tests or screenings that may be performed during your third trimester include:  Blood tests that check for low iron levels (anemia).  Fetal testing to check the health, activity level, and growth of  the fetus. Testing is done if you have certain medical conditions or if there are problems during the pregnancy.  Nonstress test (NST). This test checks the health of your baby to make sure there are no signs of problems, such as the baby not getting enough oxygen. During this test, a belt is placed around your belly. The baby is made to move, and its heart rate is monitored during movement. What is false labor? False labor is a condition in which you feel small, irregular tightenings of the muscles in the womb (contractions) that usually go away with rest, changing position, or drinking water. These are called Braxton Hicks contractions. Contractions may last for hours, days, or even weeks before true labor sets in. If contractions come at regular intervals, become more frequent, increase in intensity, or become painful, you should see your health care provider. What are the signs of labor?  Abdominal cramps.  Regular contractions that start at 10 minutes apart and become stronger and more frequent with time.  Contractions that start on the top of the uterus and spread down to the lower abdomen and back.  Increased pelvic pressure and dull back pain.  A watery or bloody mucus discharge that comes from the vagina.  Leaking of amniotic fluid. This is also known as your "water breaking." It could be a slow trickle or a gush. Let your health care provider know if it has a color or strange odor. If you have any of these signs, call your health care provider right away, even if it is before your due date. Follow these instructions at home: Medicines  Follow your health care provider's instructions regarding medicine use. Specific medicines may be either safe or unsafe to take during pregnancy.  Take a prenatal vitamin that contains at least 600 micrograms (mcg) of folic acid.  If you develop constipation, try taking a stool softener if your health care provider approves. Eating and  drinking   Eat a balanced diet that includes fresh fruits and vegetables, whole grains, good sources of protein such as meat, eggs, or tofu, and low-fat dairy. Your health care provider will help you determine the amount of weight gain that is right for you.  Avoid raw meat and uncooked cheese. These carry germs that can cause birth defects in the baby.  If you have low calcium intake from food, talk to your health care provider about whether you should take a daily calcium supplement.  Eat four or five small meals rather than three large meals a day.  Limit foods that are high in fat and processed sugars, such as fried and sweet foods.  To prevent constipation: ? Drink enough fluid to keep your urine clear or pale yellow. ? Eat foods that are high in fiber, such as fresh fruits and vegetables, whole grains, and beans. Activity  Exercise only as directed by your health care provider. Most women can continue their usual exercise routine during pregnancy. Try to  exercise for 30 minutes at least 5 days a week. Stop exercising if you experience uterine contractions.  Avoid heavy lifting.  Do not exercise in extreme heat or humidity, or at high altitudes.  Wear low-heel, comfortable shoes.  Practice good posture.  You may continue to have sex unless your health care provider tells you otherwise. Relieving pain and discomfort  Take frequent breaks and rest with your legs elevated if you have leg cramps or low back pain.  Take warm sitz baths to soothe any pain or discomfort caused by hemorrhoids. Use hemorrhoid cream if your health care provider approves.  Wear a good support bra to prevent discomfort from breast tenderness.  If you develop varicose veins: ? Wear support pantyhose or compression stockings as told by your healthcare provider. ? Elevate your feet for 15 minutes, 3-4 times a day. Prenatal care  Write down your questions. Take them to your prenatal visits.  Keep all  your prenatal visits as told by your health care provider. This is important. Safety  Wear your seat belt at all times when driving.  Make a list of emergency phone numbers, including numbers for family, friends, the hospital, and police and fire departments. General instructions  Avoid cat litter boxes and soil used by cats. These carry germs that can cause birth defects in the baby. If you have a cat, ask someone to clean the litter box for you.  Do not travel far distances unless it is absolutely necessary and only with the approval of your health care provider.  Do not use hot tubs, steam rooms, or saunas.  Do not drink alcohol.  Do not use any products that contain nicotine or tobacco, such as cigarettes and e-cigarettes. If you need help quitting, ask your health care provider.  Do not use any medicinal herbs or unprescribed drugs. These chemicals affect the formation and growth of the baby.  Do not douche or use tampons or scented sanitary pads.  Do not cross your legs for long periods of time.  To prepare for the arrival of your baby: ? Take prenatal classes to understand, practice, and ask questions about labor and delivery. ? Make a trial run to the hospital. ? Visit the hospital and tour the maternity area. ? Arrange for maternity or paternity leave through employers. ? Arrange for family and friends to take care of pets while you are in the hospital. ? Purchase a rear-facing car seat and make sure you know how to install it in your car. ? Pack your hospital bag. ? Prepare the baby's nursery. Make sure to remove all pillows and stuffed animals from the baby's crib to prevent suffocation.  Visit your dentist if you have not gone during your pregnancy. Use a soft toothbrush to brush your teeth and be gentle when you floss. Contact a health care provider if:  You are unsure if you are in labor or if your water has broken.  You become dizzy.  You have mild pelvic  cramps, pelvic pressure, or nagging pain in your abdominal area.  You have lower back pain.  You have persistent nausea, vomiting, or diarrhea.  You have an unusual or bad smelling vaginal discharge.  You have pain when you urinate. Get help right away if:  Your water breaks before 37 weeks.  You have regular contractions less than 5 minutes apart before 37 weeks.  You have a fever.  You are leaking fluid from your vagina.  You have spotting or bleeding  from your vagina.  You have severe abdominal pain or cramping.  You have rapid weight loss or weight gain.  You have shortness of breath with chest pain.  You notice sudden or extreme swelling of your face, hands, ankles, feet, or legs.  Your baby makes fewer than 10 movements in 2 hours.  You have severe headaches that do not go away when you take medicine.  You have vision changes. Summary  The third trimester is from week 28 through week 40, months 7 through 9. The third trimester is a time when the unborn baby (fetus) is growing rapidly.  During the third trimester, your discomfort may increase as you and your baby continue to gain weight. You may have abdominal, leg, and back pain, sleeping problems, and an increased need to urinate.  During the third trimester your breasts will keep growing and they will continue to become tender. A yellow fluid (colostrum) may leak from your breasts. This is the first milk you are producing for your baby.  False labor is a condition in which you feel small, irregular tightenings of the muscles in the womb (contractions) that eventually go away. These are called Braxton Hicks contractions. Contractions may last for hours, days, or even weeks before true labor sets in.  Signs of labor can include: abdominal cramps; regular contractions that start at 10 minutes apart and become stronger and more frequent with time; watery or bloody mucus discharge that comes from the vagina; increased  pelvic pressure and dull back pain; and leaking of amniotic fluid. This information is not intended to replace advice given to you by your health care provider. Make sure you discuss any questions you have with your health care provider. Document Revised: 09/22/2018 Document Reviewed: 07/07/2016 Elsevier Patient Education  2020 [employer].

## 2020-05-29 DIAGNOSIS — O26843 Uterine size-date discrepancy, third trimester: Secondary | ICD-10-CM | POA: Insufficient documentation

## 2020-06-05 ENCOUNTER — Telehealth: Payer: Self-pay | Admitting: Obstetrics and Gynecology

## 2020-06-05 ENCOUNTER — Encounter: Payer: Self-pay | Admitting: Family Medicine

## 2020-06-05 NOTE — Telephone Encounter (Signed)
Patient was called by the interpreter, and there was no answer. Interpreter stated the doctor told her she didn't have to come each week if she didn't feel like coming.

## 2020-06-11 ENCOUNTER — Encounter: Payer: Self-pay | Admitting: Obstetrics and Gynecology

## 2020-06-11 ENCOUNTER — Ambulatory Visit (INDEPENDENT_AMBULATORY_CARE_PROVIDER_SITE_OTHER): Payer: Self-pay | Admitting: Obstetrics and Gynecology

## 2020-06-11 ENCOUNTER — Other Ambulatory Visit: Payer: Self-pay | Admitting: Obstetrics and Gynecology

## 2020-06-11 ENCOUNTER — Other Ambulatory Visit: Payer: Self-pay

## 2020-06-11 VITALS — BP 110/67 | HR 69 | Wt 116.1 lb

## 2020-06-11 DIAGNOSIS — O0993 Supervision of high risk pregnancy, unspecified, third trimester: Secondary | ICD-10-CM

## 2020-06-11 DIAGNOSIS — O359XX0 Maternal care for (suspected) fetal abnormality and damage, unspecified, not applicable or unspecified: Secondary | ICD-10-CM

## 2020-06-11 DIAGNOSIS — Z3493 Encounter for supervision of normal pregnancy, unspecified, third trimester: Secondary | ICD-10-CM

## 2020-06-11 DIAGNOSIS — Z3A39 39 weeks gestation of pregnancy: Secondary | ICD-10-CM

## 2020-06-11 DIAGNOSIS — Z0289 Encounter for other administrative examinations: Secondary | ICD-10-CM

## 2020-06-11 DIAGNOSIS — O34219 Maternal care for unspecified type scar from previous cesarean delivery: Secondary | ICD-10-CM

## 2020-06-11 NOTE — Progress Notes (Signed)
IOL scheduled for 06/20/20 in AM.

## 2020-06-11 NOTE — Progress Notes (Signed)
PRENATAL VISIT NOTE  Subjective:  Erika Humphrey is a 24 y.o. G2P1001 at [redacted]w[redacted]d being seen today for ongoing prenatal care.  She is currently monitored for the following issues for this high-risk pregnancy and has Supervision of low-risk pregnancy, third trimester; Previous cesarean delivery, antepartum; Refugee status; Fetal left hydronephrosis (37mm) noted on ultrasound, female fetus; Size of fetus inconsistent with dates in third trimester; and [redacted] weeks gestation of pregnancy on their problem list.  Patient reports no complaints.  Contractions: Not present. Vag. Bleeding: None.  Movement: Present. Denies leaking of fluid. Pt continues to decline gtt and GBS screen today.  The following portions of the patient's history were reviewed and updated as appropriate: allergies, current medications, past family history, past medical history, past social history, past surgical history and problem list.   Objective:   Vitals:   06/11/20 1037  BP: 110/67  Pulse: 69  Weight: 116 lb 1.6 oz (52.7 kg)    Fetal Status: Fetal Heart Rate (bpm): 146 Fundal Height: 35 cm Movement: Present  Presentation: Vertex  General:  Alert, oriented and cooperative. Patient is in no acute distress.  Skin: Skin is warm and dry. No rash noted.   Cardiovascular: Normal heart rate noted  Respiratory: Normal respiratory effort, no problems with respiration noted  Abdomen: Soft, gravid, appropriate for gestational age.  Pain/Pressure: Present  Low transverse remote surgical incision visible. Cephalic fetal presentation on bedside ultrasound.   Pelvic: Cervical exam deferred        Extremities: Normal range of motion.  Edema: None  Mental Status: Eye contact improved from last visit. Appears guarded. Denies safety concerns. Normal behavior. Normal judgment and thought content.   Assessment and Plan:  Pregnancy: G2P1001 at [redacted]w[redacted]d 1. [redacted] weeks gestation of pregnancy, Supervision of low-risk pregnancy, third trimester: Pt  endorses +FM and +FHTs in clinic today. No red flag symptoms. Pt with fundal height less than dates today but recent ultrasound at [redacted]w[redacted]d with EFW 16% and appropriate increase in fundal height since last appt on 12/14. Cephalic on bedside ultrasound again today. - continue daily prenatal vitamin - pt continues to decline GBS testing today (again offered option of self swab) - scheduled PD IOL today for [redacted]w[redacted]d; discussed with pt & placed IOL admission orders - provided strict return precautions for decreased fetal movement, vaginal bleeding, loss of fluid and signs/symptoms of labor  2. Previous cesarean delivery, antepartum: Pt with history of LTCS x1 in [place]. She strongly desires TOLAC if possible. In conversation with pt through interpreter on 12/14, it seems that pt was hospitalized for several days and then had an emergent Cesarean for breech presentation. However, somewhat unclear given minimal information provided by patient. Abdominal exam with low transverse remote incision.  - TOLAC consent form signed on 04/26/20  3. Refugee status: Pt with improved engagement throughout clinic visit today. Of note, video interpreter was used. Pt's sponsor was not present in room for pt's appointment.  4. Fetal abnormality in antepartum pregnancy, single or unspecified fetus: Fetal left hydronephrosis. Pt made aware. Will ensure peds is notified on admission to L&D.  5. Size of fetus inconsistent with dates in third trimester: Fundal height continues to be less than dates today, but appropriate increase in fundal height of 3cm from last appt 2 weeks ago. Weight consistent with last appointment 1 week ago. Reassuringly, recent ultrasound at [redacted]w[redacted]d with EFW 16%. - PD IOL scheduled for 06/20/20 as noted above  Term labor symptoms and general obstetric precautions including but  not limited to vaginal bleeding, contractions, leaking of fluid and fetal movement were reviewed in detail with the patient. Please  refer to After Visit Summary for other counseling recommendations.  Return in about 1 week (around 06/18/2020) for pt scheduled for post-dates IOL today.  Future Appointments  Date Time Provider Department Center  06/18/2020  9:40 AM [facility]-SCREENING [facility] None  06/18/2020 10:35 AM Briant Sites [facility]  06/20/2020  6:45 AM [facility]-LD SCHED ROOM [facility] None   Sheila Oats, MD OB Fellow, [facility] 06/11/2020 6:50 PM

## 2020-06-11 NOTE — Progress Notes (Signed)
Orders placed for post-dates IOL.  Sheila Oats, MD OB Fellow[facility] 06/11/2020 6:53 PM

## 2020-06-11 NOTE — Patient Instructions (Signed)
Third Trimester of Pregnancy The third trimester is from week 28 through week 40 (months 7 through 9). The third trimester is a time when the unborn baby (fetus) is growing rapidly. At the end of the ninth month, the fetus is about 20 inches in length and weighs 6-10 pounds. Body changes during your third trimester Your body will continue to go through many changes during pregnancy. The changes vary from woman to woman. During the third trimester:  Your weight will continue to increase. You can expect to gain 25-35 pounds (11-16 kg) by the end of the pregnancy.  You may begin to get stretch marks on your hips, abdomen, and breasts.  You may urinate more often because the fetus is moving lower into your pelvis and pressing on your bladder.  You may develop or continue to have heartburn. This is caused by increased hormones that slow down muscles in the digestive tract.  You may develop or continue to have constipation because increased hormones slow digestion and cause the muscles that push waste through your intestines to relax.  You may develop hemorrhoids. These are swollen veins (varicose veins) in the rectum that can itch or be painful.  You may develop swollen, bulging veins (varicose veins) in your legs.  You may have increased body aches in the pelvis, back, or thighs. This is due to weight gain and increased hormones that are relaxing your joints.  You may have changes in your hair. These can include thickening of your hair, rapid growth, and changes in texture. Some women also have hair loss during or after pregnancy, or hair that feels dry or thin. Your hair will most likely return to normal after your baby is born.  Your breasts will continue to grow and they will continue to become tender. A yellow fluid (colostrum) may leak from your breasts. This is the first milk you are producing for your baby.  Your belly button may stick out.  You may notice more swelling in your hands,  face, or ankles.  You may have increased tingling or numbness in your hands, arms, and legs. The skin on your belly may also feel numb.  You may feel short of breath because of your expanding uterus.  You may have more problems sleeping. This can be caused by the size of your belly, increased need to urinate, and an increase in your body's metabolism.  You may notice the fetus "dropping," or moving lower in your abdomen (lightening).  You may have increased vaginal discharge.  You may notice your joints feel loose and you may have pain around your pelvic bone. What to expect at prenatal visits You will have prenatal exams every 2 weeks until week 36. Then you will have weekly prenatal exams. During a routine prenatal visit:  You will be weighed to make sure you and the baby are growing normally.  Your blood pressure will be taken.  Your abdomen will be measured to track your baby's growth.  The fetal heartbeat will be listened to.  Any test results from the previous visit will be discussed.  You may have a cervical check near your due date to see if your cervix has softened or thinned (effaced).  You will be tested for Group B streptococcus. This happens between 35 and 37 weeks. Your health care provider may ask you:  What your birth plan is.  How you are feeling.  If you are feeling the baby move.  If you have had any abnormal   symptoms, such as leaking fluid, bleeding, severe headaches, or abdominal cramping.  If you are using any tobacco products, including cigarettes, chewing tobacco, and electronic cigarettes.  If you have any questions. Other tests or screenings that may be performed during your third trimester include:  Blood tests that check for low iron levels (anemia).  Fetal testing to check the health, activity level, and growth of the fetus. Testing is done if you have certain medical conditions or if there are problems during the pregnancy.  Nonstress test  (NST). This test checks the health of your baby to make sure there are no signs of problems, such as the baby not getting enough oxygen. During this test, a belt is placed around your belly. The baby is made to move, and its heart rate is monitored during movement. What is false labor? False labor is a condition in which you feel small, irregular tightenings of the muscles in the womb (contractions) that usually go away with rest, changing position, or drinking water. These are called Braxton Hicks contractions. Contractions may last for hours, days, or even weeks before true labor sets in. If contractions come at regular intervals, become more frequent, increase in intensity, or become painful, you should see your health care provider. What are the signs of labor?  Abdominal cramps.  Regular contractions that start at 10 minutes apart and become stronger and more frequent with time.  Contractions that start on the top of the uterus and spread down to the lower abdomen and back.  Increased pelvic pressure and dull back pain.  A watery or bloody mucus discharge that comes from the vagina.  Leaking of amniotic fluid. This is also known as your "water breaking." It could be a slow trickle or a gush. Let your health care provider know if it has a color or strange odor. If you have any of these signs, call your health care provider right away, even if it is before your due date. Follow these instructions at home: Medicines  Follow your health care provider's instructions regarding medicine use. Specific medicines may be either safe or unsafe to take during pregnancy.  Take a prenatal vitamin that contains at least 600 micrograms (mcg) of folic acid.  If you develop constipation, try taking a stool softener if your health care provider approves. Eating and drinking   Eat a balanced diet that includes fresh fruits and vegetables, whole grains, good sources of protein such as meat, eggs, or tofu,  and low-fat dairy. Your health care provider will help you determine the amount of weight gain that is right for you.  Avoid raw meat and uncooked cheese. These carry germs that can cause birth defects in the baby.  If you have low calcium intake from food, talk to your health care provider about whether you should take a daily calcium supplement.  Eat four or five small meals rather than three large meals a day.  Limit foods that are high in fat and processed sugars, such as fried and sweet foods.  To prevent constipation: ? Drink enough fluid to keep your urine clear or pale yellow. ? Eat foods that are high in fiber, such as fresh fruits and vegetables, whole grains, and beans. Activity  Exercise only as directed by your health care provider. Most women can continue their usual exercise routine during pregnancy. Try to exercise for 30 minutes at least 5 days a week. Stop exercising if you experience uterine contractions.  Avoid heavy lifting.  Do   not exercise in extreme heat or humidity, or at high altitudes.  Wear low-heel, comfortable shoes.  Practice good posture.  You may continue to have sex unless your health care provider tells you otherwise. Relieving pain and discomfort  Take frequent breaks and rest with your legs elevated if you have leg cramps or low back pain.  Take warm sitz baths to soothe any pain or discomfort caused by hemorrhoids. Use hemorrhoid cream if your health care provider approves.  Wear a good support bra to prevent discomfort from breast tenderness.  If you develop varicose veins: ? Wear support pantyhose or compression stockings as told by your healthcare provider. ? Elevate your feet for 15 minutes, 3-4 times a day. Prenatal care  Write down your questions. Take them to your prenatal visits.  Keep all your prenatal visits as told by your health care provider. This is important. Safety  Wear your seat belt at all times when driving.  Make  a list of emergency phone numbers, including numbers for family, friends, the hospital, and police and fire departments. General instructions  Avoid cat litter boxes and soil used by cats. These carry germs that can cause birth defects in the baby. If you have a cat, ask someone to clean the litter box for you.  Do not travel far distances unless it is absolutely necessary and only with the approval of your health care provider.  Do not use hot tubs, steam rooms, or saunas.  Do not drink alcohol.  Do not use any products that contain nicotine or tobacco, such as cigarettes and e-cigarettes. If you need help quitting, ask your health care provider.  Do not use any medicinal herbs or unprescribed drugs. These chemicals affect the formation and growth of the baby.  Do not douche or use tampons or scented sanitary pads.  Do not cross your legs for long periods of time.  To prepare for the arrival of your baby: ? Take prenatal classes to understand, practice, and ask questions about labor and delivery. ? Make a trial run to the hospital. ? Visit the hospital and tour the maternity area. ? Arrange for maternity or paternity leave through employers. ? Arrange for family and friends to take care of pets while you are in the hospital. ? Purchase a rear-facing car seat and make sure you know how to install it in your car. ? Pack your hospital bag. ? Prepare the baby's nursery. Make sure to remove all pillows and stuffed animals from the baby's crib to prevent suffocation.  Visit your dentist if you have not gone during your pregnancy. Use a soft toothbrush to brush your teeth and be gentle when you floss. Contact a health care provider if:  You are unsure if you are in labor or if your water has broken.  You become dizzy.  You have mild pelvic cramps, pelvic pressure, or nagging pain in your abdominal area.  You have lower back pain.  You have persistent nausea, vomiting, or  diarrhea.  You have an unusual or bad smelling vaginal discharge.  You have pain when you urinate. Get help right away if:  Your water breaks before 37 weeks.  You have regular contractions less than 5 minutes apart before 37 weeks.  You have a fever.  You are leaking fluid from your vagina.  You have spotting or bleeding from your vagina.  You have severe abdominal pain or cramping.  You have rapid weight loss or weight gain.  You have   shortness of breath with chest pain.  You notice sudden or extreme swelling of your face, hands, ankles, feet, or legs.  Your baby makes fewer than 10 movements in 2 hours.  You have severe headaches that do not go away when you take medicine.  You have vision changes. Summary  The third trimester is from week 28 through week 40, months 7 through 9. The third trimester is a time when the unborn baby (fetus) is growing rapidly.  During the third trimester, your discomfort may increase as you and your baby continue to gain weight. You may have abdominal, leg, and back pain, sleeping problems, and an increased need to urinate.  During the third trimester your breasts will keep growing and they will continue to become tender. A yellow fluid (colostrum) may leak from your breasts. This is the first milk you are producing for your baby.  False labor is a condition in which you feel small, irregular tightenings of the muscles in the womb (contractions) that eventually go away. These are called Braxton Hicks contractions. Contractions may last for hours, days, or even weeks before true labor sets in.  Signs of labor can include: abdominal cramps; regular contractions that start at 10 minutes apart and become stronger and more frequent with time; watery or bloody mucus discharge that comes from the vagina; increased pelvic pressure and dull back pain; and leaking of amniotic fluid. This information is not intended to replace advice given to you by your  health care provider. Make sure you discuss any questions you have with your health care provider. Document Revised: 09/22/2018 Document Reviewed: 07/07/2016 Elsevier Patient Education  2020 [employer]  

## 2020-06-12 ENCOUNTER — Telehealth (HOSPITAL_COMMUNITY): Payer: Self-pay | Admitting: *Deleted

## 2020-06-12 NOTE — Telephone Encounter (Signed)
Preadmission screen Interpreter number unable to obtain a phone interpreter

## 2020-06-13 ENCOUNTER — Telehealth (HOSPITAL_COMMUNITY): Payer: Self-pay | Admitting: *Deleted

## 2020-06-13 ENCOUNTER — Ambulatory Visit (INDEPENDENT_AMBULATORY_CARE_PROVIDER_SITE_OTHER): Payer: Self-pay | Admitting: General Practice

## 2020-06-13 ENCOUNTER — Other Ambulatory Visit: Payer: Self-pay

## 2020-06-13 ENCOUNTER — Telehealth: Payer: Self-pay | Admitting: *Deleted

## 2020-06-13 DIAGNOSIS — R300 Dysuria: Secondary | ICD-10-CM

## 2020-06-13 DIAGNOSIS — N3000 Acute cystitis without hematuria: Secondary | ICD-10-CM

## 2020-06-13 LAB — POCT URINALYSIS DIP (DEVICE)
Bilirubin Urine: NEGATIVE
Glucose, UA: NEGATIVE mg/dL
Hgb urine dipstick: NEGATIVE
Ketones, ur: NEGATIVE mg/dL
Nitrite: NEGATIVE
Protein, ur: NEGATIVE mg/dL
Specific Gravity, Urine: 1.015 (ref 1.005–1.030)
Urobilinogen, UA: 0.2 mg/dL (ref 0.0–1.0)
pH: 6.5 (ref 5.0–8.0)

## 2020-06-13 MED ORDER — CEPHALEXIN 500 MG PO CAPS: 500.0000 mg | ORAL_CAPSULE | Freq: Four times a day (QID) | ORAL | 0 refills | Status: DC

## 2020-06-13 NOTE — Progress Notes (Signed)
Patient presents to office today reporting dysuria with cramping as well as incomplete emptying for the past couple of days. UA reveals trace leuks. Discussed with Dr Adrian Blackwater who orders keflex 500mg  QID x 7 days as well as urine culture. Rx & culture ordered for patient. Reviewed medication instructions with patient as well as information about COVID testing appointment. Patient verbalized understanding. Communicated with patient through " " who is part of sponsorship program. Patient speaks a specific dialect of Pashto.  Janene Harvey RN BSN 06/13/20

## 2020-06-13 NOTE — Progress Notes (Signed)
Chart reviewed - agree with CMA/RN documentation.  ° °

## 2020-06-13 NOTE — Telephone Encounter (Signed)
Preadmission screen Interpreter number unable to obtain an interpreter via language line.

## 2020-06-13 NOTE — Telephone Encounter (Signed)
Call received from pt's advisor Arletha Pili. (Pt is refugee from [place]) She stated that she has received a call from another advisor Elita Quick) who reported concerns about the patient feeling weak and her activity of staying in bed constantly for the past week. Also that the pt is having some urinary problems. Ann did not have any other details about the urinary problem. Both she and the other advisor Pam have great concerns about the patient being depressed. Per chart review, these concerns were not raised during appt earlier this week on 12/28. Dewayne Hatch stated that the advisor assigned to pt that Ladarious Kresse arrived late and so was not able to advocate well for pt or address all concerns. I advised that we can see pt today for a nurse visit @ 3:30 and try to determine what urinary problems she is having. Dewayne Hatch stated that she will go to pt's house to pick her up but could not guarantee that pt will get out of bed. I advised that pt should be seen and if she will not come to appt today @ 3:30, then she should go to [facility] for evaluation. I emphasized that if pt is having pain of any kind, she must be seen. Ann voiced understanding.

## 2020-06-15 ENCOUNTER — Inpatient Hospital Stay (HOSPITAL_COMMUNITY): Payer: Medicaid Other | Admitting: Anesthesiology

## 2020-06-15 ENCOUNTER — Encounter (HOSPITAL_COMMUNITY): Payer: Self-pay | Admitting: Student

## 2020-06-15 ENCOUNTER — Other Ambulatory Visit: Payer: Self-pay

## 2020-06-15 ENCOUNTER — Inpatient Hospital Stay (HOSPITAL_COMMUNITY)
Admission: AD | Admit: 2020-06-15 | Discharge: 2020-06-18 | DRG: 807 | Disposition: A | Discharge status: Home / Self Care | Payer: Medicaid Other | Attending: Obstetrics & Gynecology | Admitting: Obstetrics & Gynecology

## 2020-06-15 DIAGNOSIS — O34219 Maternal care for unspecified type scar from previous cesarean delivery: Secondary | ICD-10-CM | POA: Diagnosis present

## 2020-06-15 DIAGNOSIS — Z23 Encounter for immunization: Secondary | ICD-10-CM | POA: Diagnosis not present

## 2020-06-15 DIAGNOSIS — O99893 Other specified diseases and conditions complicating puerperium: Secondary | ICD-10-CM | POA: Diagnosis not present

## 2020-06-15 DIAGNOSIS — Z20822 Contact with and (suspected) exposure to covid-19: Secondary | ICD-10-CM | POA: Diagnosis present

## 2020-06-15 DIAGNOSIS — O358XX0 Maternal care for other (suspected) fetal abnormality and damage, not applicable or unspecified: Secondary | ICD-10-CM | POA: Diagnosis present

## 2020-06-15 DIAGNOSIS — Z3A40 40 weeks gestation of pregnancy: Secondary | ICD-10-CM

## 2020-06-15 DIAGNOSIS — O359XX0 Maternal care for (suspected) fetal abnormality and damage, unspecified, not applicable or unspecified: Secondary | ICD-10-CM | POA: Diagnosis present

## 2020-06-15 DIAGNOSIS — G8918 Other acute postprocedural pain: Secondary | ICD-10-CM | POA: Diagnosis not present

## 2020-06-15 DIAGNOSIS — Z0289 Encounter for other administrative examinations: Secondary | ICD-10-CM

## 2020-06-15 DIAGNOSIS — O26843 Uterine size-date discrepancy, third trimester: Secondary | ICD-10-CM | POA: Diagnosis present

## 2020-06-15 DIAGNOSIS — O26893 Other specified pregnancy related conditions, third trimester: Secondary | ICD-10-CM | POA: Diagnosis present

## 2020-06-15 DIAGNOSIS — Z3493 Encounter for supervision of normal pregnancy, unspecified, third trimester: Secondary | ICD-10-CM

## 2020-06-15 DIAGNOSIS — O34211 Maternal care for low transverse scar from previous cesarean delivery: Secondary | ICD-10-CM

## 2020-06-15 DIAGNOSIS — Z98891 History of uterine scar from previous surgery: Secondary | ICD-10-CM | POA: Diagnosis not present

## 2020-06-15 DIAGNOSIS — Z8759 Personal history of other complications of pregnancy, childbirth and the puerperium: Secondary | ICD-10-CM | POA: Diagnosis not present

## 2020-06-15 LAB — CBC
HCT: 36.2 % (ref 36.0–46.0)
Hemoglobin: 11.9 g/dL — ABNORMAL LOW (ref 12.0–15.0)
MCH: 29 pg (ref 26.0–34.0)
MCHC: 32.9 g/dL (ref 30.0–36.0)
MCV: 88.3 fL (ref 80.0–100.0)
Platelets: 318 10*3/uL (ref 150–400)
RBC: 4.1 MIL/uL (ref 3.87–5.11)
RDW: 12.4 % (ref 11.5–15.5)
WBC: 13.3 10*3/uL — ABNORMAL HIGH (ref 4.0–10.5)
nRBC: 0 % (ref 0.0–0.2)

## 2020-06-15 LAB — CULTURE, OB URINE

## 2020-06-15 LAB — TYPE AND SCREEN
ABO/RH(D): A POS
Antibody Screen: NEGATIVE

## 2020-06-15 LAB — RESP PANEL BY RT-PCR (FLU A&B, COVID) ARPGX2
Influenza A by PCR: NEGATIVE
Influenza B by PCR: NEGATIVE
SARS Coronavirus 2 by RT PCR: NEGATIVE

## 2020-06-15 LAB — URINE CULTURE, OB REFLEX

## 2020-06-15 LAB — RPR: RPR Ser Ql: NONREACTIVE

## 2020-06-15 MED ORDER — OXYCODONE-ACETAMINOPHEN 5-325 MG PO TABS: 2.0000 | ORAL_TABLET | ORAL | Status: DC | PRN

## 2020-06-15 MED ORDER — LACTATED RINGERS IV SOLN: 500.0000 mL | INTRAVENOUS | Status: DC | PRN

## 2020-06-15 MED ORDER — DIPHENHYDRAMINE HCL 50 MG/ML IJ SOLN: 12.5000 mg | INTRAMUSCULAR | Status: DC | PRN

## 2020-06-15 MED ORDER — OXYCODONE-ACETAMINOPHEN 5-325 MG PO TABS: 1.0000 | ORAL_TABLET | ORAL | Status: DC | PRN

## 2020-06-15 MED ORDER — EPHEDRINE 5 MG/ML INJ: 10.0000 mg | INTRAVENOUS | Status: DC | PRN

## 2020-06-15 MED ORDER — LIDOCAINE HCL (PF) 1 % IJ SOLN
INTRAMUSCULAR | Status: DC | PRN
  Administered 2020-06-15: 2 mL via EPIDURAL
  Administered 2020-06-15: 10 mL via EPIDURAL

## 2020-06-15 MED ORDER — FENTANYL-BUPIVACAINE-NACL 0.5-0.125-0.9 MG/250ML-% EP SOLN
12.0000 mL/h | EPIDURAL | Status: DC | PRN
Start: 2020-06-15 — End: 2020-06-16
  Filled 2020-06-15: qty 250

## 2020-06-15 MED ORDER — TERBUTALINE SULFATE 1 MG/ML IJ SOLN: 0.2500 mg | Freq: Once | INTRAMUSCULAR | Status: DC | PRN

## 2020-06-15 MED ORDER — FENTANYL CITRATE (PF) 100 MCG/2ML IJ SOLN
INTRAMUSCULAR | Status: AC
  Administered 2020-06-15: 100 ug
  Filled 2020-06-15: qty 2

## 2020-06-15 MED ORDER — PHENYLEPHRINE 40 MCG/ML (10ML) SYRINGE FOR IV PUSH (FOR BLOOD PRESSURE SUPPORT)
80.0000 ug | PREFILLED_SYRINGE | INTRAVENOUS | Status: DC | PRN
  Filled 2020-06-15: qty 10

## 2020-06-15 MED ORDER — ONDANSETRON HCL 4 MG/2ML IJ SOLN
4.0000 mg | Freq: Four times a day (QID) | INTRAMUSCULAR | Status: DC | PRN
  Administered 2020-06-15: 4 mg via INTRAVENOUS
  Filled 2020-06-15: qty 2

## 2020-06-15 MED ORDER — LACTATED RINGERS IV SOLN: INTRAVENOUS | Status: DC

## 2020-06-15 MED ORDER — LIDOCAINE HCL (PF) 1 % IJ SOLN: 30.0000 mL | INTRAMUSCULAR | Status: DC | PRN

## 2020-06-15 MED ORDER — ACETAMINOPHEN 325 MG PO TABS: 650.0000 mg | ORAL_TABLET | ORAL | Status: DC | PRN

## 2020-06-15 MED ORDER — OXYTOCIN-SODIUM CHLORIDE 30-0.9 UT/500ML-% IV SOLN
2.5000 [IU]/h | INTRAVENOUS | Status: DC
  Administered 2020-06-16: 2.5 [IU]/h via INTRAVENOUS
  Filled 2020-06-15: qty 500

## 2020-06-15 MED ORDER — LACTATED RINGERS IV SOLN
500.0000 mL | Freq: Once | INTRAVENOUS | Status: AC
  Administered 2020-06-15: 500 mL via INTRAVENOUS

## 2020-06-15 MED ORDER — SODIUM CHLORIDE 0.9 % IV SOLN
INTRAVENOUS | Status: AC
  Filled 2020-06-15: qty 500

## 2020-06-15 MED ORDER — OXYTOCIN BOLUS FROM INFUSION: 333.0000 mL | Freq: Once | INTRAVENOUS | Status: DC

## 2020-06-15 MED ORDER — OXYTOCIN-SODIUM CHLORIDE 30-0.9 UT/500ML-% IV SOLN
1.0000 m[IU]/min | INTRAVENOUS | Status: DC
  Administered 2020-06-15: 2 m[IU]/min via INTRAVENOUS

## 2020-06-15 MED ORDER — PHENYLEPHRINE 40 MCG/ML (10ML) SYRINGE FOR IV PUSH (FOR BLOOD PRESSURE SUPPORT): 80.0000 ug | PREFILLED_SYRINGE | INTRAVENOUS | Status: DC | PRN

## 2020-06-15 MED ORDER — FENTANYL CITRATE (PF) 100 MCG/2ML IJ SOLN: 100.0000 ug | INTRAMUSCULAR | Status: DC | PRN

## 2020-06-15 MED ORDER — SOD CITRATE-CITRIC ACID 500-334 MG/5ML PO SOLN: 30.0000 mL | ORAL | Status: DC | PRN

## 2020-06-15 MED ORDER — SODIUM CHLORIDE (PF) 0.9 % IJ SOLN
INTRAMUSCULAR | Status: DC | PRN
  Administered 2020-06-15: 12 mL/h via EPIDURAL

## 2020-06-15 NOTE — H&P (Signed)
OBSTETRIC ADMISSION HISTORY AND PHYSICAL  Erika Humphrey is a 25 y.o. female G2P1001 with IUP at [redacted]w[redacted]d by late third trimester ultrasound presenting for spontaneous onset of labor. She reports +FMs, No LOF, no VB, no blurry vision, headaches or peripheral edema, and RUQ pain.  She plans on breast feeding. She request nothing for birth control. She received her prenatal care at [facility]   Dating: By third trimester ultrasound --->  Estimated Date of Delivery: 06/13/20  Sono:    @[redacted]w[redacted]d , CWD, normal anatomy, cephalic presentation, 2398g, 10-19-1993 EFW   Prenatal History/Complications: limited prenatal care, recent refugee from [place] (03/2020), fetal renal hydronephrosis  Past Medical History: No past medical history on file.  Past Surgical History: No past surgical history on file.  Obstetrical History: OB History    Gravida  2   Para  1   Term  1   Preterm  0   AB  0   Living  1     SAB  0   IAB  0   Ectopic  0   Multiple  0   Live Births  1           Social History Social History   Socioeconomic History  . Marital status: Married    Spouse name: Not on file  . Number of children: Not on file  . Years of education: Not on file  . Highest education level: Not on file  Occupational History  . Not on file  Tobacco Use  . Smoking status: Never Smoker  . Smokeless tobacco: Never Used  Vaping Use  . Vaping Use: Never used  Substance and Sexual Activity  . Alcohol use: Never  . Drug use: Never  . Sexual activity: Yes  Other Topics Concern  . Not on file  Social History Narrative  . Not on file   Social Determinants of Health   Financial Resource Strain: Not on file  Food Insecurity: Food Insecurity Present  . Worried About 04/2020 in the Last Year: Sometimes true  . Ran Out of Food in the Last Year: Often true  Transportation Needs: Unmet Transportation Needs  . Lack of Transportation (Medical): Yes  . Lack of Transportation  (Non-Medical): Yes  Physical Activity: Not on file  Stress: Not on file  Social Connections: Not on file    Family History: No family history on file.  Allergies: No Known Allergies  Medications Prior to Admission  Medication Sig Dispense Refill Last Dose  . cephALEXin (KEFLEX) 500 MG capsule Take 1 capsule (500 mg total) by mouth 4 (four) times daily. 28 capsule 0      Review of Systems   All systems reviewed and negative except as stated in HPI  Last menstrual period 08/28/2019. General appearance: alert and mild distress Lungs: clear to auscultation bilaterally Heart: regular rate and rhythm Abdomen: soft, non-tender; bowel sounds normal Pelvic: n/a Extremities: Homans sign is negative, no sign of DVT DTR's +2 Presentation: cephalic Fetal monitoringBaseline: 140 bpm, Variability: Good {> 6 bpm), Accelerations: Reactive and Decelerations: Absent Uterine activity: every 3-5 by palpation     Prenatal labs: ABO, Rh: A/Positive/-- (11/12 0931) Antibody: Negative (11/12 0931) Rubella: 4.85 (11/12 0931) RPR: Non Reactive (11/12 0931)  HBsAg: Negative (11/12 0931)  HIV: Non Reactive (11/12 0931)  GBS:   declined  Prenatal Transfer Tool  Maternal Diabetes: No Genetic Screening: Declined Maternal Ultrasounds/Referrals: Normal Fetal Ultrasounds or other Referrals:  None Maternal Substance Abuse:  No Significant Maternal  Medications:  None Significant Maternal Lab Results: None  No results found for this or any previous visit (from the past 24 hour(s)).  Patient Active Problem List   Diagnosis Date Noted  . [redacted] weeks gestation of pregnancy 06/11/2020  . Size of fetus inconsistent with dates in third trimester 05/29/2020  . Fetal left hydronephrosis (60mm) noted on ultrasound, female fetus 05/14/2020  . Supervision of low-risk pregnancy, third trimester 04/26/2020  . Previous cesarean delivery, antepartum 04/26/2020  . Refugee status 04/26/2020    Assessment/Plan:   Erika Humphrey is a 25 y.o. G2P1001 at [redacted]w[redacted]d here for spontaneous onset of labor  #Labor: expectant management. Patient has extreme fear of exams and interventions #Pain: Per patient request #FWB: Cat 1 #ID:  Refused GBS swab #MOF: breast #MOC: none  Rolm Bookbinder, CNM  06/15/2020, 10:43 AM

## 2020-06-15 NOTE — Progress Notes (Signed)
Labor Progress Note Erika Humphrey is a 25 y.o. G2P1001 at [redacted]w[redacted]d presented for spontaneous onset of labor  S:  Patient comfortable with epidural  O:  BP 119/70 (BP Location: Right Arm)   Pulse 91   Temp (!) 97.5 F (36.4 C) (Oral)   Resp 18   LMP 08/28/2019 (Approximate)   Fetal Tracing:  Baseline: 135 Variability: moderate Accels: 15x15 Decels: none  Toco: 2-6   CVE: Dilation: 6 Effacement (%): 100 Station: 0 (bulging bag) Presentation: Vertex Exam by:: Antony Odea   A&P: 25 y.o. G2P1001 [redacted]w[redacted]d spontaneous onset of labor #Labor: Progressing well. Patient desires expectant management at this time. Will recheck in 2-3 hours. #Pain: epidural  #FWB: Cat 1 #GBS not done, refused  Rolm Bookbinder, CNM 12:52 PM

## 2020-06-15 NOTE — Anesthesia Preprocedure Evaluation (Signed)
Anesthesia Evaluation  Patient identified by MRN, date of birth, ID band Patient awake    Reviewed: Allergy & Precautions, Patient's Chart, lab work & pertinent test results  Airway Mallampati: II  TM Distance: >3 FB Neck ROM: Full    Dental no notable dental hx.    Pulmonary neg pulmonary ROS,    Pulmonary exam normal breath sounds clear to auscultation       Cardiovascular negative cardio ROS Normal cardiovascular exam Rhythm:Regular Rate:Normal     Neuro/Psych negative neurological ROS  negative psych ROS   GI/Hepatic negative GI ROS, Neg liver ROS,   Endo/Other  negative endocrine ROS  Renal/GU negative Renal ROS  negative genitourinary   Musculoskeletal negative musculoskeletal ROS (+)   Abdominal   Peds negative pediatric ROS (+)  Hematology negative hematology ROS (+) hct 36.2, plt 318   Anesthesia Other Findings   Reproductive/Obstetrics (+) Pregnancy TOLAC, refugee from [place]. Does not know why she had a c section in [place]                              Anesthesia Physical Anesthesia Plan  ASA: II and emergent  Anesthesia Plan: Epidural   Post-op Pain Management:    Induction:   PONV Risk Score and Plan: 2  Airway Management Planned: Natural Airway  Additional Equipment: None  Intra-op Plan:   Post-operative Plan:   Informed Consent: I have reviewed the patients History and Physical, chart, labs and discussed the procedure including the risks, benefits and alternatives for the proposed anesthesia with the patient or authorized representative who has indicated his/her understanding and acceptance.       Plan Discussed with:   Anesthesia Plan Comments:         Anesthesia Quick Evaluation

## 2020-06-15 NOTE — Anesthesia Procedure Notes (Signed)
Epidural Patient location during procedure: OB Start time: 06/15/2020 11:26 AM End time: 06/15/2020 11:41 AM  Staffing Anesthesiologist: Lannie Fields, DO Performed: anesthesiologist   Preanesthetic Checklist Completed: patient identified, IV checked, risks and benefits discussed, monitors and equipment checked, pre-op evaluation and timeout performed  Epidural Patient position: sitting Prep: DuraPrep and site prepped and draped Patient monitoring: continuous pulse ox, blood pressure, heart rate and cardiac monitor Approach: midline Location: L3-L4 Injection technique: LOR air  Needle:  Needle type: Tuohy  Needle gauge: 17 G Needle length: 9 cm Needle insertion depth: 6 cm Catheter type: closed end flexible Catheter size: 19 Gauge Catheter at skin depth: 11 cm Test dose: negative  Assessment Sensory level: T8 Events: blood not aspirated, injection not painful, no injection resistance, no paresthesia and negative IV test  Additional Notes Patient identified. Risks/Benefits/Options discussed with patient including but not limited to bleeding, infection, nerve damage, paralysis, failed block, incomplete pain control, headache, blood pressure changes, nausea, vomiting, reactions to medication both or allergic, itching and postpartum back pain. Confirmed with bedside nurse the patient's most recent platelet count. Confirmed with patient that they are not currently taking any anticoagulation, have any bleeding history or any family history of bleeding disorders. Patient expressed understanding and wished to proceed. All questions were answered. Sterile technique was used throughout the entire procedure. Please see nursing notes for vital signs. Test dose was given through epidural catheter and negative prior to continuing to dose epidural or start infusion. Warning signs of high block given to the patient including shortness of breath, tingling/numbness in hands, complete motor  block, or any concerning symptoms with instructions to call for help. Patient was given instructions on fall risk and not to get out of bed. All questions and concerns addressed with instructions to call with any issues or inadequate analgesia.  Reason for block:procedure for pain

## 2020-06-15 NOTE — L&D Delivery Note (Addendum)
.  Delivery Note At 5:29 AM a non-viable child was delivered via Vaginal, Spontaneous (Presentation:  breech   ).  APGAR: 0, 0; weight  pending.   Placenta status:  spontaneous, intact, delivered simultaneously with infant. Cord:    3 vessel with the following complications: none .    Anesthesia:  epidural Episiotomy:  None Lacerations:  None Suture Repair:  n/a Est. Blood Loss (mL):  100 ml  Mom to  remain on labor and delivery until discharge in 6-8 hours if stable .  Baby to [place].  Matthew Folks Johndrow is a 25 y.o. female G3P2002 with IUP at [redacted]w[redacted]d admitted for advanced dilation and hour-glassing membranes.  She was admitted 24+ hours prior to delivery and had IV pain medication intermittently until ~ 4 am when she reported worsening pain.  Stadol 2 mg IV was given which did not relieve pain. Discussed options and pt elected to have an epidural. Consent signed. Dr Donavan Foil to room and epidural placed.  When lying pt back down after epidural, bulging membranes/pocket of fluid 5-6 cm in diameter were protruding from introitus.  NICU paged to room given pt dating by 15 week [place].  Attempt to obtain FHT was not successful.  With delivery imminent, pt encouraged to push and fetus began to descend into sac in breech position.  Body of infant delivered but head entrapped. Pt with poor pushing effort.  Pt encouraged  to push by RN and CNM.  Dr Alysia Penna paged but when unable to reach Dr Alysia Penna, Dr Mindi Slicker was paged and came quickly to room.  Dr Mindi Slicker delivered rest of infant, encouraging pt to push effectively.  Pashto language/phone interpreter used for all communication.  Placenta intact and spontaneous, delivered simultaneously with infant. Bleeding minimal.  No lacerations. Mom given opportunity to see infant, she elected not to hold infant after delivery.  She desires discharge as soon as she is stable.  She requests OCPs vs Depo for birth control.   Sharen Counter 02/17/2021, 5:55 AM   Addendum Agree with note  above

## 2020-06-15 NOTE — Progress Notes (Signed)
Labor Progress Note Erika Humphrey is a 25 y.o. G2P1001 at [redacted]w[redacted]d presented for spontaneous onset of labor  S:  Patient resting comfortably. Reports intermittent pressure  O:  BP 118/68   Pulse 91   Temp 97.6 F (36.4 C) (Axillary)   Resp 18   LMP 08/28/2019 (Approximate)   Fetal Tracing:  Baseline: 145 Variability: moderate Accels: 15x15 Decels: early  Toco: 1-3   CVE: Dilation: Lip/rim Effacement (%): 100 Station: 0 (bulging bag) Presentation: Vertex Exam by:: C Yordan Martindale CNM   A&P: 25 y.o. G2P1001 [redacted]w[redacted]d SOL #Labor: Progressing well. Discussed with patient risks and benefits of AROM for augmentation of labor. Patient agreeable to plan of care. AROM with large amount of clear fluid. Patient and FHR tolerated procedure well. Will recheck in 1-2 hours #Pain: epidural #FWB: Cat 1 #GBS not done, refused  Rolm Bookbinder, CNM 4:44 PM

## 2020-06-15 NOTE — Progress Notes (Signed)
Pt declined ctx monitor.  Mark button and palpation used to determine pattern.

## 2020-06-15 NOTE — MAU Note (Signed)
Pt arrived with support person.  Unable to obtain interpreter at this time.  Pt is declining contraction monitoring and exam at this time.

## 2020-06-15 NOTE — Progress Notes (Signed)
Patient ID: Erika Humphrey, female   DOB: Apr 23, 1996, 25 y.o.   MRN: [id number]  Has been pushing another 1.5hrs and has made some progress but is now stating she cannot push anymore and is requesting assistance (using both in-person and speakerphone Pashto interpreters)  BP 114/66, P 115, T 97.6 FHR 155-160s, +accels, early variables Ctx q 2-3 mins, spont Vtx now almost +3  IUP@40 .2wks End 1st stage  Dr Debroah Loop called per pt request to help facilitate operative delivery  Erika Humphrey [facility] 06/15/2020 10:58 PM

## 2020-06-15 NOTE — L&D Delivery Note (Signed)
Operative Delivery Note At 12:13 AM a viable female was delivered via VBAC, Vacuum Assisted.  Presentation: vertex; Position: Left,, Occiput,, Anterior; Station: +3.  Verbal consent: obtained from patient. Interpreter present. Risks and benefits discussed in detail.  Risks include, but are not limited to the risks of anesthesia, bleeding, infection, damage to maternal tissues, fetal cephalhematoma.  There is also the risk of inability to effect vaginal delivery of the head, or shoulder dystocia that cannot be resolved by established maneuvers, leading to the need for emergency cesarean section. There were 3 pop offs with head on the perineum and she was allowed to continue pushing as there were variables with recovery. When FHR persisted in the 80's vacuum was reapplied and delivery was immediate. Baby was handed to NICU staff with Dr. Eric Form present  APGAR: , ; weight  .   Placenta status: , .   Cord:  with the following complications: .  Cord pH: no result from specimen  Anesthesia:  Epidural Instruments: Kiwi Episiotomy: None Lacerations: 2nd degree Suture Repair: 3.0 vicryl rapide Est. Blood Loss (mL):  200   Mom to postpartum.  Baby to NICU.  Scheryl Darter 06/16/2020, 12:35 AM

## 2020-06-15 NOTE — Progress Notes (Signed)
Patient ID: Erika Humphrey, female   DOB: [birth year], 25 y.o.   MRN: [id number]  Pt complete at 1817; has pushed approx 20 mins from 2000-2020 with minimal effort, potentially due to communication barriers. Has in-person Pashto interpreter. Pt questions plan and if 'an operation' would be appropriate.  BP 121/65, P 105 FHR 155, +accels, early variables Ctx q 2-3 mins, spont Vtx visible with labia spread, at +2 station  IUP@40 .2wks Prev C/S End 1st stage Language barrier  Through interpreter, discussed that the baby is so low in the pelvis that it would be best to try pushing again and proceed in that direction if she can get the hang of it; pt is agreeable. Repositioned, given blankets for chills, offered juice/broth, and tried pushing with decent movement of vtx. Will continue with plan and anticipate vag del.  Erika Humphrey CNM 06/15/2020

## 2020-06-16 ENCOUNTER — Encounter (HOSPITAL_COMMUNITY): Payer: Self-pay | Admitting: Student

## 2020-06-16 MED ORDER — COCONUT OIL OIL: 1.0000 "application " | TOPICAL_OIL | Status: DC | PRN

## 2020-06-16 MED ORDER — PRENATAL MULTIVITAMIN CH
1.0000 | ORAL_TABLET | Freq: Every day | ORAL | Status: DC
  Administered 2020-06-16 – 2020-06-18 (×3): 1 via ORAL
  Filled 2020-06-16 (×3): qty 1

## 2020-06-16 MED ORDER — IBUPROFEN 600 MG PO TABS
600.0000 mg | ORAL_TABLET | Freq: Four times a day (QID) | ORAL | Status: DC
  Administered 2020-06-16 – 2020-06-18 (×11): 600 mg via ORAL
  Filled 2020-06-16 (×11): qty 1

## 2020-06-16 MED ORDER — ZOLPIDEM TARTRATE 5 MG PO TABS: 5.0000 mg | ORAL_TABLET | Freq: Every evening | ORAL | Status: DC | PRN

## 2020-06-16 MED ORDER — BENZOCAINE-MENTHOL 20-0.5 % EX AERO: 1.0000 "application " | INHALATION_SPRAY | CUTANEOUS | Status: DC | PRN

## 2020-06-16 MED ORDER — DIPHENHYDRAMINE HCL 25 MG PO CAPS: 25.0000 mg | ORAL_CAPSULE | Freq: Four times a day (QID) | ORAL | Status: DC | PRN

## 2020-06-16 MED ORDER — DIBUCAINE (PERIANAL) 1 % EX OINT: 1.0000 "application " | TOPICAL_OINTMENT | CUTANEOUS | Status: DC | PRN

## 2020-06-16 MED ORDER — OXYCODONE HCL 5 MG PO TABS
5.0000 mg | ORAL_TABLET | ORAL | Status: DC | PRN
  Administered 2020-06-16 – 2020-06-17 (×5): 5 mg via ORAL
  Filled 2020-06-16 (×5): qty 1

## 2020-06-16 MED ORDER — TETANUS-DIPHTH-ACELL PERTUSSIS 5-2.5-18.5 LF-MCG/0.5 IM SUSY
0.5000 mL | PREFILLED_SYRINGE | Freq: Once | INTRAMUSCULAR | Status: AC
  Administered 2020-06-18: 0.5 mL via INTRAMUSCULAR
  Filled 2020-06-16: qty 0.5

## 2020-06-16 MED ORDER — WITCH HAZEL-GLYCERIN EX PADS: 1.0000 "application " | MEDICATED_PAD | CUTANEOUS | Status: DC | PRN

## 2020-06-16 MED ORDER — SIMETHICONE 80 MG PO CHEW: 80.0000 mg | CHEWABLE_TABLET | ORAL | Status: DC | PRN

## 2020-06-16 MED ORDER — SENNOSIDES-DOCUSATE SODIUM 8.6-50 MG PO TABS
2.0000 | ORAL_TABLET | ORAL | Status: DC
  Administered 2020-06-16 – 2020-06-18 (×3): 2 via ORAL
  Filled 2020-06-16 (×3): qty 2

## 2020-06-16 MED ORDER — ONDANSETRON HCL 4 MG/2ML IJ SOLN: 4.0000 mg | INTRAMUSCULAR | Status: DC | PRN

## 2020-06-16 MED ORDER — HYDROXYZINE HCL 50 MG/ML IM SOLN
100.0000 mg | Freq: Once | INTRAMUSCULAR | Status: AC
  Administered 2020-06-16: 100 mg via INTRAMUSCULAR
  Filled 2020-06-16: qty 2

## 2020-06-16 MED ORDER — ONDANSETRON HCL 4 MG PO TABS: 4.0000 mg | ORAL_TABLET | ORAL | Status: DC | PRN

## 2020-06-16 MED ORDER — ACETAMINOPHEN 325 MG PO TABS
650.0000 mg | ORAL_TABLET | ORAL | Status: DC | PRN
  Administered 2020-06-17 (×2): 650 mg via ORAL
  Filled 2020-06-16 (×2): qty 2

## 2020-06-16 MED ORDER — CEPHALEXIN 500 MG PO CAPS
500.0000 mg | ORAL_CAPSULE | Freq: Four times a day (QID) | ORAL | Status: DC
  Administered 2020-06-16 – 2020-06-18 (×7): 500 mg via ORAL
  Filled 2020-06-16 (×8): qty 1

## 2020-06-16 MED ORDER — MEASLES, MUMPS & RUBELLA VAC IJ SOLR: 0.5000 mL | Freq: Once | INTRAMUSCULAR | Status: DC

## 2020-06-16 NOTE — Discharge Instructions (Signed)

## 2020-06-16 NOTE — Lactation Note (Signed)
This note was copied from a baby's chart. Lactation Consultation Note  Patient Name: Erika Humphrey [id number] Date: 06/16/2020   Age:27 hours   Attempted to visit with mom; she's an [place] refuge and speaks Pashto, a dialect from her country. Unfortunately, our hospital doesn't have that dialect in the [place] interpreter services I-pad; so LC attempted to communicate with mom using a Goggle translate print out, asking her to circle "Yes" or "Not" but mom doesn't know how to read or write.  LC showed her the print out in her native language and pointed out to the "Yes" and "No" field but mom signaled to [facility] that she can't not read (she signaled her yes) or write (she signaled the pencil) she did both shaking her head from side to side signaling "no".  RN Santina Evans is trying to get in hold to the only interpreter in the area that speaks Pashto, and she'll call LC once the interpreter is on the line in order to do initial assessment. Baby is FT and currently in the NICU due to respiratory failure and seizing activity.    Maternal Data    Feeding    LATCH Score                   Interventions    Lactation Tools Discussed/Used     Consult Status      Erika Humphrey [name] 06/16/2020, 12:54 PM

## 2020-06-16 NOTE — Progress Notes (Signed)
Patient constantly complaining of ongoing moderate/severe pain at her c-section scar.  Patient states according to phone interpreter that the pain feels worse than having a baby. Provider Cam Hai, CNM made aware of patient complaint and confirmed PP orders for needed pain medication.

## 2020-06-16 NOTE — Progress Notes (Signed)
Unable to fully assess patient during her first and second fundal rub after vaginal delivery. Education of risk and benefits of fundal rubs were explained using interpreter via phone. Light massage was used.

## 2020-06-16 NOTE — Anesthesia Postprocedure Evaluation (Signed)
Anesthesia Post Note  Patient: Erika Humphrey  Procedure(s) Performed: AN AD HOC LABOR EPIDURAL     Patient location during evaluation: Mother Baby Anesthesia Type: Epidural Level of consciousness: awake and alert Pain management: pain level controlled Vital Signs Assessment: post-procedure vital signs reviewed and stable Respiratory status: spontaneous breathing, nonlabored ventilation and respiratory function stable Cardiovascular status: stable Postop Assessment: no headache, no backache and epidural receding Anesthetic complications: no Comments: Spoke to patient with an interpreter    No complications documented.  Last Vitals:  Vitals:   06/16/20 0325 06/16/20 0817  BP: (!) 113/58 (!) 109/54  Pulse: 69 64  Resp: 18 18  Temp: 37.2 C 36.6 C  SpO2: 95% 96%    Last Pain:  Vitals:   06/16/20 0817  TempSrc: Oral  PainSc:    Pain Goal: Patients Stated Pain Goal: 2 (06/15/20 1210)                 [facility] Records

## 2020-06-16 NOTE — Discharge Summary (Signed)
Postpartum Discharge Summary  Date of Service updated  06/18/2020     Patient Name: Erika Humphrey DOB: [birth year] MRN: [id number]  Date of admission: 06/15/2020 Delivery date:06/16/2020  Delivering provider: [place] Mode  Date of discharge: 06/18/2020  Admitting diagnosis: Indication for care in labor and delivery, antepartum [O75.9] Intrauterine pregnancy: [redacted]w[redacted]d     Secondary diagnosis:  Active Problems:   Previous cesarean delivery, antepartum   Refugee status   Fetal left hydronephrosis (77mm) noted on ultrasound, female fetus   Size of fetus inconsistent with dates in third trimester  Additional problems: terminal fetal bradycardia    Discharge diagnosis: Term Pregnancy Delivered and VBAC                                              Post partum procedures:none Augmentation: Pitocin , AROM Complications: None  Hospital course: Onset of Labor With Vaginal Delivery      25 y.o. yo G2P1001 at [redacted]w[redacted]d was admitted in Active Labor on 06/15/2020. Patient had an uncomplicated labor course as follows:  Membrane Rupture Time/Date: 4:30 PM ,06/15/2020   Delivery Method:VBAC, Vacuum Assisted  Episiotomy: None  Lacerations:  2nd degree;Perineal  Patient had an uncomplicated postpartum course.  She is ambulating, tolerating a regular diet, passing flatus, and urinating well. Patient is discharged home in stable condition on 06/18/20.  Newborn Data: Birth date:06/16/2020  Birth time:12:13 AM  Gender:Female  Living status:Living  Apgars:1 ,3 , 4 Weight:2810 g (6lb 3.1oz)  Magnesium Sulfate received: No BMZ received: No Rhophylac:No MMR:No T-DaP:Given prenatally Flu: No Transfusion:No  Physical exam  Vitals:   06/17/20 1949 06/18/20 0531 06/18/20 0532 06/18/20 0804  BP: (!) 102/53 (!) 99/48 (!) 111/59 112/63  Pulse: 78 (!) 58 60 67  Resp: $Remo'18  18 18  'gnrWs$ Temp: 98.1 F (36.7 C)  97.6 F (36.4 C) 97.9 F (36.6 C)  TempSrc: Oral  Oral Oral  SpO2: 99%  99% 99%   General: alert,  cooperative and no distress Lochia: appropriate Uterine Fundus: firm Incision: N/A DVT Evaluation: No evidence of DVT seen on physical exam. Negative Homan's sign. No cords or calf tenderness. Labs: Lab Results  Component Value Date   WBC 13.3 (H) 06/15/2020   HGB 11.9 (L) 06/15/2020   HCT 36.2 06/15/2020   MCV 88.3 06/15/2020   PLT 318 06/15/2020   CMP Latest Ref Rng & Units 04/26/2020  Glucose 65 - 99 mg/dL 78  BUN 6 - 20 mg/dL 7  Creatinine 0.57 - 1.00 mg/dL 0.52(L)  Sodium 134 - 144 mmol/L 138  Potassium 3.5 - 5.2 mmol/L 4.2  Chloride 96 - 106 mmol/L 106  CO2 20 - 29 mmol/L 18(L)  Calcium 8.7 - 10.2 mg/dL 8.6(L)  Total Protein 6.0 - 8.5 g/dL 6.5  Total Bilirubin 0.0 - 1.2 mg/dL <0.2  Alkaline Phos 44 - 121 IU/L 135(H)  AST 0 - 40 IU/L 20  ALT 0 - 32 IU/L 22   Edinburgh Score: Edinburgh Postnatal Depression Scale Screening Tool 06/17/2020  I have been able to laugh and see the funny side of things. 0  I have looked forward with enjoyment to things. 0  I have blamed myself unnecessarily when things went wrong. 0  I have been anxious or worried for no good reason. 0  I have felt scared or panicky for no good reason. 0  Things  have been getting on top of me. 0  I have been so unhappy that I have had difficulty sleeping. 0  I have felt sad or miserable. 0  I have been so unhappy that I have been crying. 0  The thought of harming myself has occurred to me. 0  Edinburgh Postnatal Depression Scale Total 0     After visit meds:  Allergies as of 06/18/2020   No Known Allergies     Medication List    TAKE these medications   cephALEXin 500 MG capsule Commonly known as: KEFLEX Take 1 capsule (500 mg total) by mouth 4 (four) times daily.   docusate sodium 100 MG capsule Commonly known as: COLACE Take 1 capsule (100 mg total) by mouth 2 (two) times daily as needed.   ibuprofen 600 MG tablet Commonly known as: ADVIL Take 1 tablet (600 mg total) by mouth every 6 (six)  hours.        Discharge home in stable condition Infant Feeding: Bottle Infant Disposition:NICU Discharge instruction: per After Visit Summary and Postpartum booklet. Activity: Advance as tolerated. Pelvic rest for 6 weeks.  Diet: routine diet Future Appointments: Future Appointments  Date Time Provider [place]  07/15/2020  1:35 PM Lajean Manes, CNM [facility]   Follow up Visit:  [place] [facility] [place] [facility]. Schedule an appointment as soon as possible for a visit in 4 week(s).   Specialty: Obstetrics and Gynecology Why: postpartum visit Contact information: [place] [phone] [phone]              Myrtis Ser, CNM  P [facility] Admin Pool Please schedule this patient for Postpartum visit in: 4 weeks with the following provider: female provider  In-Person  For C/S patients schedule nurse incision check in weeks 2 weeks: no  Low risk pregnancy complicated by: prev C/S; fetal hydronephrosis  Delivery mode: Vacuum VBAC  Anticipated Birth Control: declines  PP Procedures needed: Pap  Schedule Integrated [place] visit: no   06/18/2020 Griffin Basil, MD

## 2020-06-17 DIAGNOSIS — G8918 Other acute postprocedural pain: Secondary | ICD-10-CM

## 2020-06-17 DIAGNOSIS — O99893 Other specified diseases and conditions complicating puerperium: Secondary | ICD-10-CM

## 2020-06-17 DIAGNOSIS — Z8759 Personal history of other complications of pregnancy, childbirth and the puerperium: Secondary | ICD-10-CM

## 2020-06-17 NOTE — Clinical Social Work Maternal (Signed)
CLINICAL SOCIAL WORK MATERNAL/CHILD NOTE  Patient Details  Name: Erika Humphrey MRN: [id number] Date of Birth: 05/27/1996  Date:  05/02/2021  Clinical Social Worker Initiating Note:  Glenard Haring Boyd-Gilyard Date/Time: Initiated:  06/17/20/1324     Child's Name:  Parents have not name infant at this time.   Biological Parents:  Mother,Father   Need for Interpreter:  Other (Comment Required) (MOB and FOB speaks [id number])   Reason for Referral:      Address:  [address] [place] [address]    Phone number:  [phone] (home)     Additional phone number: Volunteer with the [place] [facility] [place] [phone]  Household Members/Support Persons (HM/SP):   Household Member/Support Person 1,Household Member/Support Person 2   HM/SP Name Relationship DOB or Age  HM/SP -1 Lorinda Creed FOB/Husband 02/13/1991  HM/SP -2 Earnstine Regal daughter 63 years old  HM/SP -3        HM/SP -4        HM/SP -5        HM/SP -6        HM/SP -7        HM/SP -8          Natural Supports (not living in the home):  Community,Friends,Neighbors   Professional Supports: Organized support group (Comment)   Employment: Unemployed   Type of Work:     Education:  Other (comment) (MOB reported that she never attended school in [place])   [place] arranged: No  Financial Resources:  Medicaid   Other Resources:  Physicist, medical  ,[place] Considerations Which May Impact Care:  None reported  Strengths:  Ability to meet basic needs  ,Home prepared for child  ,Pediatrician chosen   Psychotropic Medications:         Pediatrician:    [place]olic[place] area  Pediatrician List:   [facility] [place]      Pediatrician Fax Number:    Risk Factors/Current Problems:  Other (Comment) (Family is new to the country and has language barriers.)   Cognitive State:   Alert  ,Insightful  ,Linear Thinking     Mood/Affect:  Interested  ,Comfortable  ,Relaxed  ,Flat  ,Calm     CSW Assessment: CSW met with  MOB at MOB's bedside in room 116. When CSW arrived, MOB had a visitor; she identified herself as the volunteer from the [employer]).  CSW utilized interpreting services through [employer] and MOB gave CSW permission to complete the assessment while Clarise Cruz was present.  MOB was easy to engage however she appeared flat and soft spoken. CSW explained CSW's role and resources and supports that CSW will provide while infant remains inpatient.   CSW reviewed NICU visitation and encouraged MOB to ask questions.  Clarise Cruz requested to have more than 3 people on infant's visitation and CSW explained that the request will need to be approved by Nursing Admin. CSW agreed to speak with someone from [place] and follow-up with Clarise Cruz.  Per Clarise Cruz, it will be beneficially to have a volunteer with MOB and FOB 24/7 however 1 volunteer cannot commit to being present during parents daily visits.  CSW spoke with Nursing Director and it was approved to have 2 approved visitors aside from MOB and FOB. CSW updated support person Clarise Cruz via telephone.   CSW provided  MOB and support person with information to add infant to MOB's Food Stamp application, WIC application and [employer].   Per MOB and Volunteer, MOB has a safe sleeping area (bed) for infant however, does not have a car seat. Per Volunteer, the family will have a car seat prior to infant's discharge.  MOB and volunteer is aware to contact CSW if they are unable to obtain a car seat prior to discharge.   MOB reported not feeling well informed by medical team and request to have NNP or Neo to come and provide an update.  CSW spoke with NNP and communicated MOB's request. NNP or Neo will follow-up with family.   MOB denied having barriers to visiting with infant post MOB's discharge. MOB's volunteer  also shared that other volunteers will assist with transportation and other essential items needed for the family.   CSW will continue to offer resources and supports to family while infant remains in NICU.    CSW Plan/Description:  Psychosocial Support and Ongoing Assessment of Needs,Perinatal Mood and Anxiety Disorder (PMADs) Education,Other Patient/Family Education,Other Information/Referral to [employer], MSW, [employer] Social Work [phone]  Dimple Nanas, LCSW 06/17/2020, 1:41 PM

## 2020-06-17 NOTE — Progress Notes (Signed)
Faculty Attending Note  Post Partum Day 1  Subjective: Patient is feeling okay, has some leg and abdominal pain that is improved today. She reports moderately well controlled pain on PO pain meds. She is ambulating and denies light-headedness or dizziness. She is tolerating a regular diet without nausea/vomiting. Bleeding is moderate. She is breast & bottle feeding. Baby is in NICU and not doing well, she is wondering about baby's status.  Objective: Blood pressure 108/61, pulse 61, temperature (!) 97.4 F (36.3 C), temperature source Oral, resp. rate 18, last menstrual period 08/28/2019, SpO2 99 %, unknown if currently breastfeeding. Temp:  [97.4 F (36.3 C)-98.2 F (36.8 C)] 97.4 F (36.3 C) (01/03 0800) Pulse Rate:  [61-78] 61 (01/03 0800) Resp:  [17-18] 18 (01/03 0800) BP: (104-111)/(55-61) 108/61 (01/03 0800) SpO2:  [99 %] 99 % (01/03 0800)  Physical Exam:  General: alert, oriented, cooperative Chest: normal respiratory effort Heart: RRR  Abdomen:  soft, appropriately tender to palpation  Uterine Fundus: firm, 2 fingers below the umbilicus Lochia: moderate, rubra DVT Evaluation: no evidence of DVT Extremities: no edema, no calf tenderness  UOP: Foley catheter in place  Recent Labs    06/15/20 1020  HGB 11.9*  HCT 36.2    Assessment/Plan: Patient Active Problem List   Diagnosis Date Noted  . Indication for care in labor and delivery, antepartum 06/15/2020  . Size of fetus inconsistent with dates in third trimester 05/29/2020  . Fetal left hydronephrosis (20mm) noted on ultrasound, female fetus 05/14/2020  . Supervision of low-risk pregnancy, third trimester 04/26/2020  . Previous cesarean delivery, antepartum 04/26/2020  . Refugee status 04/26/2020    Patient is 25 y.o. [id number] PPD#1 s/p VA-VBAC at [redacted]w[redacted]d. Course complicated by inability to void and foley catheter placed overnight with good output. Will leave in place 24 hrs to let bladder rest. She is doing well  otherwise, with some leg and abdominal pain. Tolerating diet. Encouraged ambulation today. She is asking about her baby, encouraged her to visit baby in NICU and speak with neonatology.   Will review if patient is appropriate for couplet care Continue routine post partum care Pain meds prn Regular diet declines birth control Plan for discharge possibly tomorrow  Pashto translator used via the refugee volunteer present with patient  K. Therese Sarah, MD, [facility] Attending Center for [employer] (Faculty Practice)  06/17/2020, 8:24 AM

## 2020-06-17 NOTE — Lactation Note (Signed)
This note was copied from a baby's chart. Lactation Consultation Note LC attempted to see mom to see if she was pumping. Mom sleeping. Support person awake. Support person stated that all of the interpreters that she can call are sleeping and can be called in the morning.  LC noted DEBP at bedside set up. Asked support person if mom is pumping. SP stated no that she doesn't know what it is or how to use it.  Asked SP if she can call for Lactation when mom wakes up and Lactation can talk w/interpreter person w/mom. SP stated OK.  Patient Name: Erika Humphrey [id number] Date: 06/17/2020   Age:64 hours  Maternal Data    Feeding    LATCH Score                   Interventions    Lactation Tools Discussed/Used     Consult Status      Charyl Dancer 06/17/2020, 4:24 AM

## 2020-06-17 NOTE — Lactation Note (Signed)
This note was copied from a baby's chart. Lactation Consultation Note  Patient Name: Erika Humphrey [id number] Date: 06/17/2020   Age:27 hours  Lactation attempted to visit Erika Humphrey. Her support person asked if she could call me when ready so she could stack several provider visits while there was an interpretor available. I agreed and gave her my contact information. Her support person asked for any visual handouts related to breast pumping to help the patient understand the purpose and process of pumping.    Walker Shadow 06/17/2020, 1:11 PM

## 2020-06-17 NOTE — Lactation Note (Signed)
This note was copied from a baby's chart. Lactation Consultation Note  Patient Name: Erika Humphrey XIPJA'S Date: 06/17/2020 Reason for consult: NICU baby;Term;Other (Comment) (support persons request) Age:27 hours LC assist mom with pumping.  Demo manual pump.  Stayed duration of pumping.  [facility] referral sent. Urged support to call lactation as needed.  Night LC will check on mom. Maternal Data Formula Feeding for Exclusion: No Does the patient have breastfeeding experience prior to this delivery?: Yes  Feeding    LATCH Score                   Interventions Interventions: DEBP;Hand pump;Coconut oil  Lactation Tools Discussed/Used WIC Program:  (sent referral) Pump Education: Setup, frequency, and cleaning;Milk Storage   Consult Status Consult Status: Follow-up Date: 06/18/20 Follow-up type: In-patient    [facility] Michaelle Copas 06/17/2020, 6:23 PM

## 2020-06-17 NOTE — Lactation Note (Signed)
This note was copied from a baby's chart. Lactation Consultation Note  Patient Name: Boy Daizy Outen CEYEM'V Date: 06/17/2020 Reason for consult: Follow-up assessment;1st time breastfeeding;NICU baby;Term Age:27 hours Support person asked if I could come back later.  Mom talking with her other child on the phone right now.  Left name on white board.  Support person asked if I could come back later maybe around 5 pm when she is supposed to use the breast pump again. Urged support person to reach out if mom wanted to be seen earlier.  Maternal Data    Feeding    LATCH Score                   Interventions Interventions: Breast feeding basics reviewed;DEBP  Lactation Tools Discussed/Used Tools: Pump Breast pump type: Double-Electric Breast Pump WIC Program:  (new enrollment) Pump Education: Setup, frequency, and cleaning Initiated by:: RN Date initiated:: 06/17/20   Consult Status Consult Status: Follow-up Date: 06/18/20 Follow-up type: In-patient    Travonte Byard Michaelle Copas 06/17/2020, 3:12 PM

## 2020-06-17 NOTE — Lactation Note (Signed)
This note was copied from a baby's chart. Lactation Consultation Note  Patient Name: Boy Debbrah Sampedro ZHYQM'V Date: 06/17/2020 Reason for consult: Follow-up assessment Age:27 hours   1330 - 1405 - Lactation was paged to the room to provide patient education on breast pumping. Two support persons in the room at this time. We waited some time to obtain a voice interpretor via [employer].  I assisted Ms. Markov with using her DEBP. The size 24 flanges appeared to be appropriate, and I noted copious colostrum in her pump containers.  I educated on the purpose of breast pumping and recommended that Ms. Muff pump every 2-3 hours during the day and every 3-4 hours at night.  Ms. Spadoni does not have a breat pump at home, but she is a new enrollment with WIC. I volunteered to follow up with [facility] regarding a [facility] loaner pump on discharge.   Maternal Data Does the patient have breastfeeding experience prior to this delivery?: Yes   Interventions Interventions: Breast feeding basics reviewed  Lactation Tools Discussed/Used Tools: Pump Breast pump type: Double-Electric Breast Pump WIC Program:  (new enrollment) Pump Education: Setup, frequency, and cleaning Initiated by:: RN Date initiated:: 06/17/20   Consult Status Consult Status: Follow-up Date: 06/17/20 Follow-up type: In-patient    Walker Shadow 06/17/2020, 3:27 PM

## 2020-06-18 ENCOUNTER — Other Ambulatory Visit (HOSPITAL_COMMUNITY): Payer: Self-pay

## 2020-06-18 ENCOUNTER — Encounter: Payer: Self-pay | Admitting: Advanced Practice Midwife

## 2020-06-18 ENCOUNTER — Other Ambulatory Visit (HOSPITAL_COMMUNITY): Payer: Self-pay | Admitting: Obstetrics and Gynecology

## 2020-06-18 DIAGNOSIS — Z98891 History of uterine scar from previous surgery: Secondary | ICD-10-CM

## 2020-06-18 DIAGNOSIS — Z8759 Personal history of other complications of pregnancy, childbirth and the puerperium: Secondary | ICD-10-CM

## 2020-06-18 MED ORDER — IBUPROFEN 600 MG PO TABS: 600.0000 mg | ORAL_TABLET | Freq: Four times a day (QID) | ORAL | 0 refills | Status: DC

## 2020-06-18 MED ORDER — DOCUSATE SODIUM 100 MG PO CAPS: 100.0000 mg | ORAL_CAPSULE | Freq: Two times a day (BID) | ORAL | 2 refills | Status: DC | PRN

## 2020-06-18 NOTE — Lactation Note (Signed)
This note was copied from a baby's chart. Lactation Consultation Note  Patient Name: Erika Humphrey ZOXWR'U Date: 06/18/2020   Age:27 hours LC took moms pumped milk to NICU for baby. LC discussed with baby's RN that mom will be unable to write the date and time on her breastmilk but that support person will be bringing her daily with her breastmilk.  LC issued WIC loaner pump to mom.  Support persons will be bringing mom and her breastmilk daily. Mom is unable to read or write. LC got support person to sign for pump since she is the one who will be bringing it back (Erika Humphrey). Mom has labels and bottles to take home.  Discussed using new bottles at each pumping.   Discussed clean hands and washing pump parts. Discussed engorgement with mom .  Discussed changing pump settings as she gets more milk.  Discussed possibly pumping more often or longer depending on how her breasts feel. Reviewed what pump parts to take home with her.  Urged to follow up with lactation as needed.  Praised mom providing her breastmilk to her baby..  Maternal Data    Feeding    LATCH Score                   Interventions    Lactation Tools Discussed/Used     Consult Status      Erika Humphrey Erika Humphrey 06/18/2020, 12:58 PM

## 2020-06-18 NOTE — Progress Notes (Signed)
Pt discharged in stable condition after d/c instructions and prescriptions given. Interpreter utilized. All questions answered.

## 2020-06-18 NOTE — Progress Notes (Signed)
POSTPARTUM PROGRESS NOTE  Post Partum Day 2  Subjective:  Erika Humphrey is a 25 y.o. [id number] s/p vacuum assisted vaginal delivery at [redacted]w[redacted]d.  She reports she is doing well. No acute events overnight. She denies any problems with ambulating, voiding or po intake. Denies nausea or vomiting.  Pain is well controlled.  Lochia is light.  Objective: Blood pressure 112/63, pulse 67, temperature 97.9 F (36.6 C), temperature source Oral, resp. rate 18, last menstrual period 08/28/2019, SpO2 99 %, unknown if currently breastfeeding.  Physical Exam:  General: alert, cooperative and no distress Chest: no respiratory distress Heart:regular rate, distal pulses intact Abdomen: soft, nontender,  Uterine Fundus: firm, appropriately tender DVT Evaluation: No calf swelling or tenderness Extremities: no edema Skin: warm, dry  No results for input(s): HGB, HCT in the last 72 hours.  Assessment/Plan: Erika Humphrey is a 25 y.o. [id number] s/p vacuum assisted vaginal delivery at [redacted]w[redacted]d   PPD#2 -  Discharge home   LOS: 3 days   Mariel Aloe, MD Faculty attending 06/18/2020, 11:25 AM

## 2020-06-20 ENCOUNTER — Inpatient Hospital Stay (HOSPITAL_COMMUNITY): Payer: Self-pay

## 2020-06-20 ENCOUNTER — Inpatient Hospital Stay (HOSPITAL_COMMUNITY): Admission: AD | Admit: 2020-06-20 | Payer: Self-pay | Source: Home / Self Care | Admitting: Family Medicine

## 2020-06-21 ENCOUNTER — Ambulatory Visit: Payer: Self-pay

## 2020-06-21 NOTE — Lactation Note (Signed)
This note was copied from a baby's chart. Lactation Consultation Note  Patient Name: Erika Humphrey LTRVU'Y Date: 06/21/2020   Age:27 days  LC attempted to f/u with mother on day 5 after delivery. Mother not present. LC requested that RN vocera "3rd floor Lactation Consultant" when mother and interpreter are present.   Elder Negus, MA IBCLC 06/21/2020, 12:09 PM

## 2020-07-01 ENCOUNTER — Ambulatory Visit: Payer: Self-pay

## 2020-07-01 NOTE — Lactation Note (Signed)
This note was copied from a baby's chart. Lactation Consultation Note  Patient Name: Boy Ermel Verne HWEXH'B Date: 07/01/2020 Reason for consult: Follow-up assessment;NICU baby Age:26 wk.o.  LC to room for f/u visit. Mom present but does not speak [place]. English-speaking sponsor accompanied mother. Per RN, interpreter services does not have an interpreter who speaks her dialect. RN and/or sponsor will notify [facility] team when interpreter is available. Today, LC observed mom bringing milk for her baby. She appears to pump sufficient for infant's needs. Per RN, mother can begin bf. Mother attempted during visit but baby did not latch. RN will notify [facility] when interpreter is available.    Consult Status Consult Status: Follow-up Follow-up type: In-patient   Elder Negus, MA IBCLC 07/01/2020, 3:17 PM

## 2020-07-02 ENCOUNTER — Telehealth: Payer: Self-pay | Admitting: Family Medicine

## 2020-07-03 ENCOUNTER — Telehealth: Payer: Self-pay | Admitting: Family Medicine

## 2020-07-03 ENCOUNTER — Ambulatory Visit: Payer: Self-pay
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
## 2020-07-03 NOTE — Telephone Encounter (Signed)
Kathlynn Grate- NICU social worker at [facility] would like a call back concerning this pt and possible need for [facility] with Asher Muir and to discuss other issues. Please call Angel back ASAP. Thanks # [phone]

## 2020-07-10 NOTE — Telephone Encounter (Signed)
1/26  1000  Call placed to Kathlynn Grate - social worker. Message left on her voicemail stating that we had received her message regarding her concerns for pt. I stated that pt has office appt on 1/31 and we will make a note. She may call back if she has additional questions or concerns.

## 2020-07-15 ENCOUNTER — Ambulatory Visit: Payer: Self-pay | Admitting: Certified Nurse Midwife

## 2020-07-22 NOTE — Telephone Encounter (Signed)
Error

## 2020-07-23 ENCOUNTER — Ambulatory Visit: Payer: Self-pay

## 2020-07-23 NOTE — Lactation Note (Addendum)
This note was copied from a baby's chart. Lactation Consultation Note  Patient Name: Erika Humphrey [id number] Date: 07/23/2020 Reason for consult: Initial assessment;Other (Comment) (FTT) Age:27 wk.o.  Upon arrival to [facility] unit mom and case worker Levada Dy) are exiting room. Caseworker called interpreter using [employer]: Pashto interpreter Abdoul #ARRF used for visit. Mom states baby is now on a feeding schedule and due to feed at 7pm but cannot stay for this feeding, states needs to get home to care for 27yo and complete home chores. Mom reports plans to return to hospital tomorrow at 10am to breastfeed baby. Mom denies having pumped milk at home "only powder milk". Mom reports having an electric pump at home. LC advised mom to pump with DEBP q3hrs while away from baby, label and store milk in fridge and return to hospital in a cooler, advised milk must remain cold, bottles given. Advised an [place] will return tomorrow for 10am feeding. Mom with no further questions, expresses concerns with getting home to child and questions regarding who will care for [name]. BGilliam, RN, IBCLC  Addendum: DEBP and kit in room. BGilliam, RN, IBCLC  Feeding Mother's Current Feeding Choice: Breast Milk and Formula   Interventions Interventions: DEBP   Consult Status Consult Status: Follow-up Date: 07/24/20 Follow-up type: In-patient    Bernita Buffy 07/23/2020, 6:41 PM

## 2020-07-24 ENCOUNTER — Ambulatory Visit: Payer: Self-pay

## 2020-07-24 NOTE — Lactation Note (Incomplete)
This note was copied from a baby's chart. Lactation Consultation Note  Patient Name: Erika Humphrey Date: 07/24/2020 Reason for consult: Follow-up assessment;Infant weight loss;Other (Comment) (MD request) Age:27 wk.o.  LC entered room, mother and baby were sitting in chair and being assisted by RN and RN-student. Mother is Pashto speaking, so LC used interrupter service to assist with visit. LC completed a pre-weight and post-weight, observed breastfeeding, assisted with positioning, assisted with pace bottle feeding, and provided education.   LC completed pre weight, infant pre wt 3996 grams. LC then observed infant during breastfeeding, infant stayed alert, slightly shallow latch at first with clicking sound, LC was able to help adjust latch to be deeper and clicking stopped. Infant with adequate audible swallowing sounds and pausing between. After infant fed from both breast, post wt was completed and infant post weight was 4024 grams. Infants weight change was 28 grams, indicating poor milk transfer and supplementation needed to meet goal of 38ml per feeding. LC also complete an oral exam on infant with gloved finger, result was weak suckle and uncoordinated.  LC then assisted with pace bottle feeding, to help infant meet feeding goal of 63ml. Infant easily took 85ml, provided mother education and assistance with positioning. Complete feeding session was , additional education was provided regarding keeping baby upright and to burp infant before laying down to reduce aspiration.     Feeding Mother's Current Feeding Choice: Breast Milk and Formula Nipple Type: Dr. Lorne Skeens  LATCH Score Latch: Grasps breast easily, tongue down, lips flanged, rhythmical sucking.  Audible Swallowing: A few with stimulation  Type of Nipple: Everted at rest and after stimulation  Comfort (Breast/Nipple): Soft / non-tender  Hold (Positioning): Assistance needed to correctly position  infant at breast and maintain latch.  LATCH Score: 8   Lactation Tools Discussed/Used Tools: Pump Breast pump type: Double-Electric Breast Pump Pump Education: Setup, frequency, and cleaning;Milk Storage Reason for Pumping: supplemention Pumping frequency: when not available to breastfeed Pumped volume: 100 mL  Interventions Interventions: Assisted with latch;Support pillows;Adjust position;Breast compression  Discharge Pump: [facility] Loaner WIC Program: Yes  Consult Status Consult Status: Follow-up Date: 07/25/20 Follow-up type: In-patient    Alta Shober-Lactation Student  07/24/2020, 12:58 PM  Dahlia Byes RN IBCLC

## 2020-07-24 NOTE — Lactation Note (Addendum)
This note was copied from a baby's chart. Lactation Consultation Note  Patient Name: Erika Humphrey EUMPN'T Date: 07/24/2020 Reason for consult: Follow-up assessment;Infant weight loss;MD order Age:27 wk.o.  Video interpreter used for Pashto.  Mother, sponsor and student present.  LC consult in Pediatrics to observe feeding on 81 week old infant readmitted for weight loss.  Pre-Post weight taken during breastfeeding for approx 20 min where infant transferred 28 ml. Infant had breastfed for 3 minutes prior to observed latch. Nasal congestion heard when baby latched on 2nd breast.   When LC checked suck with gloved finger in mouth noted weak suck and baby having random rhythmical pattern.   Recommend mother supplement with 32 ml of breastmilk with a goal of 60 ml per feeding session, more if desired.  Reviewed burping, milk storage, labeling, pumping frequency, and supplementation volumes. Also had mother hold infant semi upright when bottle feeding with Dr. Theora Gianotti preemie nipple.  Encouraged outpatient appointment after discharge.   Mother instructed to pump when baby receiving formula.    Plan: Mother to breastfeed when in hospital and after feeding give 30-40 ml additional of breastmilk or formula.   Feed on demand with feeding cues at least q 3hours. Give baby additional volume if desired. Increase volume per day of life and as baby desires.  Mother is to burp baby between breasts and after feeding.  Bottle feedings should be semi upright and allow approx 10-15 minute before lying infant in crib.   When mother is at home she will pump q 3 hours and bring breastmilk to hospital. Goal is for baby to feed 8-12 times in a 24 hours period.     Feeding Mother's Current Feeding Choice: Breast Milk and Formula Nipple Type: Dr. Lorne Skeens  LATCH Score Latch: Grasps breast easily, tongue down, lips flanged, rhythmical sucking.  Audible Swallowing: A few with stimulation  Type of  Nipple: Everted at rest and after stimulation  Comfort (Breast/Nipple): Soft / non-tender  Hold (Positioning): Assistance needed to correctly position infant at breast and maintain latch.  LATCH Score: 8   Lactation Tools Discussed/Used Tools: Pump Breast pump type: Double-Electric Breast Pump Pump Education: Setup, frequency, and cleaning;Milk Storage Reason for Pumping: supplemention Pumping frequency: when not available to breastfeed Pumped volume: 100 mL  Interventions Interventions: DEBP;Education;Expressed milk  Discharge Pump: [facility] Loaner WIC Program: Yes  Consult Status Consult Status: Follow-up Date: 07/25/20 Follow-up type: In-patient    Dahlia Byes [facility] 07/24/2020, 1:59 PM

## 2020-07-24 NOTE — Lactation Note (Signed)
This note was copied from a baby's chart. Lactation Consultation Note  Patient Name: Erika Humphrey Date: 07/24/2020 Reason for consult: Follow-up assessment;Infant weight loss Age:27 wk.o.   Video interpreter used for Pashto.  Mother and sponsor present. LC appt for 10a but when LC arrived was informed baby recently given bottle of formula and now sleeping. Request RN call when baby cues or LC will return to observe feeding at 1130. Mother pumped x 3 at home this morning with volume of approx 100 ml.  Set up DEBP, reviewed cleaning and milk storage,labeling.   Mother has WIC DEBP [employer] at home.   Discussed feeding on demand with cues at least q 3 hours and waking baby for feeding if needed.  Feeding Mother's Current Feeding Choice: Breast Milk and Formula Nipple Type: Dr. Irving Burton Preemie  Interventions  Education  Discharge  DEBP at home  Consult Status Consult Status: Follow-up Date: 07/24/20 Follow-up type: In-patient    Dahlia Byes [facility] 07/24/2020, 10:50 AM

## 2020-07-24 NOTE — Lactation Note (Incomplete)
This note was copied from a baby's chart. Lactation Consultation Note  Patient Name: Erika Humphrey ZLDJT'T Date: 07/24/2020 Reason for consult: Follow-up assessment;Infant weight loss;MD order Age:27 wk.o.  Patient Name: Erika Humphrey SVXBL'T Date: 07/24/2020 Reason for consult: Follow-up assessment;Infant weight loss;Other (Comment) (MD request) Age:27 wk.o.  LC entered room, mother and baby were sitting in chair and being assisted by RN and RN-student. Mother is Pashto speaking, so LC used interrupter service to assist with visit. LC completed a pre-weight and post-weight, observed breastfeeding, assisted with positioning, assisted with pace bottle feeding, and provided education.   LC completed pre weight, infant pre wt 3996 grams. LC then observed infant during breastfeeding, infant stayed alert, slightly shallow latch at first with clicking sound, LC was able to help adjust latch to be deeper and clicking stopped. Infant with adequate audible swallowing sounds and pausing between. After infant fed from both breast, post wt was completed and infant post weight was 4024 grams. Infants weight change was 28 grams, indicating poor milk transfer and supplementation needed to meet goal of 59ml per feeding. LC also complete an oral exam on infant with gloved finger, result was weak suckle and uncoordinated.  LC then assisted with pace bottle feeding, to help infant meet feeding goal of 79ml. Infant easily took 52ml, provided mother education and assistance with positioning. Complete feeding session was , additional education was provided regarding keeping baby upright and to burp infant before laying down to reduce aspiration.    Feeding Mother's Current Feeding Choice: Breast Milk and Formula Nipple Type: Dr. Lorne Skeens  LATCH Score Latch: Grasps breast easily, tongue down, lips flanged, rhythmical sucking.  Audible Swallowing: A few with stimulation  Type of Nipple: Everted  at rest and after stimulation  Comfort (Breast/Nipple): Soft / non-tender  Hold (Positioning): Assistance needed to correctly position infant at breast and maintain latch.  LATCH Score: 8   Lactation Tools Discussed/Used Tools: Pump Breast pump type: Double-Electric Breast Pump Pump Education: Setup, frequency, and cleaning;Milk Storage Reason for Pumping: supplemention Pumping frequency: when not available to breastfeed Pumped volume: 100 mL  Interventions Interventions: DEBP;Education;Expressed milk  Discharge Pump: [facility] Loaner WIC Program: Yes  Consult Status Consult Status: Follow-up Date: 07/25/20 Follow-up type: In-patient    Dahlia Byes [facility] 07/24/2020, 3:46 PM

## 2020-07-24 NOTE — Lactation Note (Signed)
This note was copied from a baby's chart. Lactation Consultation Note  Patient Name: Erika Humphrey Date: 07/24/2020 Reason for consult: Follow-up assessment Age:27 wk.o.   LC Follow Up Visit:  Spoke with Pediatrics staff:  They will call me when mother arrives for her lactation consult.   Maternal Data    Feeding Nipple Type: Dr. Lorne Skeens  [facility] Score                    Lactation Tools Discussed/Used    Interventions    Discharge    Consult Status Consult Status: Follow-up Date: 07/24/20 Follow-up type: In-patient    [name] Momodou Consiglio 07/24/2020, 9:55 AM

## 2020-07-26 ENCOUNTER — Ambulatory Visit: Payer: Self-pay

## 2020-07-26 NOTE — Lactation Note (Addendum)
This note was copied from a baby's chart. Lactation Consultation Note  Patient Name: Erika Humphrey LOVFI'E Date: 07/26/2020   Age:27 wk.o.  I spoke with Cicero Duck, Charity fundraiser. RN felt that Mom could use some more education. She will notify me when Mom arrives at the hospital.    Lurline Hare [facility] 07/26/2020, 8:16 AM

## 2020-07-26 NOTE — Lactation Note (Addendum)
This note was copied from a baby's chart. Lactation Consultation Note  Patient Name: Erika Humphrey PZWCH'E Date: 07/26/2020 Reason for consult: Follow-up assessment;Mother's request;Other (Comment) (FTT) Age:27 wk.o.  Maternal Data    LC received a consult for infant with FTT. Mom with the use of Pashto translator, RITZ [phone], to review feedings. Mom pumped and brought in three bottles of EBM 75,90 and 64ml. Dr. Christell Constant stated they will fortify the EBM to bring calorie content up to 22 kcal/oz.   LC talked with provider Dr. Jimmy Footman plan for Mom to offer bottle first 75 ml for each feeding, breast and then pumping.   LC did pre weight of 3.775 and post weight after 12 minutes at both breasts 3.790 total volume at breast 15 ml. Infant placed STS in a football position with breast compression to keep active and alert during feedings. If Mom does not stimulate or compress the breasts he falls asleep. Mom not able or willing to continue stimulation to prolong feeding but she is able to demonstrate how to do steps we reviewed.    Mom trouble understanding the importance of many of the recommendations provided. LC able to get Mom to demonstrate how to do effective latch, compress breasts and look for signs of swallowing.   Even with the assistance of the translator, Mom has no concept of time, ml in volumes or able to read.    Mom set up with DEBP and pumped for about 5 minutes and stopped. She prefers the manual pump that I set up in the room with basin and soap for cleaning.  LC also assisted Mom with paced bottle feeding to complete goal of 75 ml for this feeding.  Dr. Christell Constant remained with Mom and translator to reinforce their feeding plan to offer bottle first with Neosure 22 kcal 75 ml per feed q 3 hours, followed by breast and her pumping using manual pump.     Feeding Mother's Current Feeding Choice: Breast Milk and Formula Nipple Type: Dr. Levert Feinstein Preemie  LATCH  Score Latch: Grasps breast easily, tongue down, lips flanged, rhythmical sucking.  Audible Swallowing: A few with stimulation  Type of Nipple: Everted at rest and after stimulation  Comfort (Breast/Nipple): Soft / non-tender  Hold (Positioning): Assistance needed to correctly position infant at breast and maintain latch.  LATCH Score: 8   Lactation Tools Discussed/Used Tools: Pump;Flanges Flange Size: 27;24 Breast pump type: Manual Pump Education: Setup, frequency, and cleaning;Milk Storage Reason for Pumping: Increase milk production Pumping frequency: every 3 hours for 15 minutes.  Interventions Interventions: Breast feeding basics reviewed;Support pillows;Assisted with latch;Position options;Skin to skin;Breast massage;DEBP;Breast compression;Adjust position;Education  Discharge    Consult Status Consult Status: Follow-up Date: 07/27/20 Follow-up type: In-patient    Tavari Loadholt  Nicholson-Springer 07/26/2020, 2:25 PM

## 2020-08-01 ENCOUNTER — Ambulatory Visit: Payer: Self-pay | Admitting: Student

## 2020-08-08 ENCOUNTER — Ambulatory Visit (INDEPENDENT_AMBULATORY_CARE_PROVIDER_SITE_OTHER): Payer: Medicaid Other | Admitting: Family Medicine

## 2020-08-08 ENCOUNTER — Encounter: Payer: Self-pay | Admitting: Family Medicine

## 2020-08-08 ENCOUNTER — Ambulatory Visit: Payer: Medicaid Other | Admitting: Clinical

## 2020-08-08 ENCOUNTER — Other Ambulatory Visit: Payer: Self-pay

## 2020-08-08 VITALS — BP 106/65 | HR 78 | Wt 116.0 lb

## 2020-08-08 DIAGNOSIS — K219 Gastro-esophageal reflux disease without esophagitis: Secondary | ICD-10-CM | POA: Diagnosis not present

## 2020-08-08 DIAGNOSIS — Z30011 Encounter for initial prescription of contraceptive pills: Secondary | ICD-10-CM

## 2020-08-08 DIAGNOSIS — O34219 Maternal care for unspecified type scar from previous cesarean delivery: Secondary | ICD-10-CM | POA: Diagnosis not present

## 2020-08-08 DIAGNOSIS — Z3493 Encounter for supervision of normal pregnancy, unspecified, third trimester: Secondary | ICD-10-CM

## 2020-08-08 DIAGNOSIS — Z603 Acculturation difficulty: Secondary | ICD-10-CM

## 2020-08-08 DIAGNOSIS — Z0289 Encounter for other administrative examinations: Secondary | ICD-10-CM

## 2020-08-08 MED ORDER — NORETHINDRONE 0.35 MG PO TABS: 1.0000 | ORAL_TABLET | Freq: Every day | ORAL | 11 refills | Status: DC

## 2020-08-08 NOTE — Progress Notes (Signed)
[facility] Partum Visit Note  Erika Humphrey is a 25 y.o. G68P2002 female who presents for a postpartum visit. She is 7 weeks postpartum following a normal spontaneous vaginal delivery.  I have fully reviewed the prenatal and intrapartum course. The delivery was at 40/2 gestational weeks.  Anesthesia: epidural. Postpartum course has been uncomplicated. Baby is doing well. Baby is feeding by both breast and bottle - Carnation Good Start. Breast feeding is going well, not taking prenatal vitamins. Bleeding no bleeding. Bowel function is normal. Bladder function is normal. Patient is sexually active. Contraception method is none. Postpartum depression screening: negative.  Abdominal pain -epigastric, generalized, worse with eating -has not taken any medication -no no n/v/d/c -no fever/chills -does not notice relation to any certain foods -no history of reflux in the past -no hematemesis -no difficulty swallowing  The pregnancy intention screening data noted above was reviewed. Potential methods of contraception were discussed. The patient elected to proceed with Oral Contraceptive.    Edinburgh Postnatal Depression Scale - 08/08/20 1344      Edinburgh Postnatal Depression Scale:  In the Past 7 Days   I have been able to laugh and see the funny side of things. 0    I have looked forward with enjoyment to things. 0    I have blamed myself unnecessarily when things went wrong. 0    I have been anxious or worried for no good reason. 0    I have felt scared or panicky for no good reason. 0    Things have been getting on top of me. 0    I have been so unhappy that I have had difficulty sleeping. 0    I have felt sad or miserable. 0    I have been so unhappy that I have been crying. 0    The thought of harming myself has occurred to me. 0    Edinburgh Postnatal Depression Scale Total 0            The following portions of the patient's history were reviewed and updated as appropriate:  allergies, current medications, past family history, past medical history, past social history, past surgical history and problem list.  Review of Systems Pertinent items are noted in HPI.    Objective:  BP 106/65   Pulse 78   Wt 116 lb (52.6 kg)   LMP 08/28/2019 (Approximate)   BMI 20.56 kg/m    General:  alert, cooperative and no distress   Breasts:  not performed  Lungs: normal respiratory effort  Heart:  regular rate  Abdomen: soft, non distended, nontender   Vulva:  not evaluated  Vagina: not evaluated  Cervix:  not evaluated  Corpus: not examined  Adnexa:  not evaluated  Rectal Exam: Not performed.        Assessment:    Normal postpartum exam. Pap smear not done at today's visit, patient declines.  Plan:   Essential components of care per ACOG recommendations:  1.  Mood and well being: Patient with negative depression screening today. Reviewed local resources for support. Given husband previously had concerns regarding patient's mood, [place] provider met with patient and identified not further needs. Patient states mood is stable, declines medication/counseling. - Patient does not use tobacco.  - hx of drug use? No    2. Infant care and feeding:  -Patient currently breastmilk feeding? Yes , not currently taking prenatal vitamins, encouraged to start -Social determinants of health (SDOH) reviewed in EPIC. No concerns  3. Sexuality, contraception and birth spacing - Patient does not want a pregnancy in the next year.  Desired family size is 3 children.  - Reviewed forms of contraception in tiered fashion. Patient desired oral progesterone-only contraceptive today, rx sent to pharmacy. - Discussed birth spacing of 18 months  4. Sleep and fatigue -Encouraged family/partner/community support of 4 hrs of uninterrupted sleep to help with mood and fatigue  5. Physical Recovery  - Discussed patients delivery and complications - Patient had a second degree laceration,  perineal healing reviewed. Patient expressed understanding - Patient has urinary incontinence? No  - Patient is safe to resume physical and sexual activity  6.  Health Maintenance - Last pap smear done unknown, patient needs pap smear, declined today. Discussed risks associated with abnormal cytology/HPV and risks of cervical cancer.  7. GERD -Patient's symptoms consisted with GERD, no red flag symptoms -Instructed to modify diet, tums, famotidine, do not eat before bedtime - PCP follow up if symptoms not resolved with conservative treatment, provided with list of PCP  Arrie Senate, [place] for [place], [place]

## 2020-08-08 NOTE — Patient Instructions (Signed)
-take prenatal vitamin while breastfeeding -take birth control pills daily, pick a time and take at the same time everyday -you can take tums, famotidine (pepcid) for your stomach pain/reflux symptoms -try to stay away from greasy, spicy foods, alcohol, chocolate, sodas/carbonated drinks -you need a pap smear, please come back when you are ready -I have attached a list of primary care providers in case your stomach pains do not improve  AREA FAMILY PRACTICE PHYSICIANS  [place] ([id number]) . [employer] [facility] o [address] [place] [place], [place] [address] o [phone] o Mon-Fri 8:30-12:30, 1:30-5:00 o Accepting Medicaid . [facility] at [facility] [address], [place], [place] [address] o [phone] o Mon-Fri 8:00-5:30 . [employer] o [address] [place], [place] [address] o [phone], Tue, Thur, Fri 8:30-5:00, Wed 10:00-7:00 (closed 1-2pm) o Accepting Medicaid . [facility] o [address], [place], [place]  [address] o Phone - [phone]   Fax - [phone]  [place] [phone]) . [facility] o [address], [place], [place] [address] o [phone] o Mon-Fri 8:00-5:00 . [facility]s at [facility])  o [address] [place] [place], [place] [address] o [phone] o Mon-Fri 8:30-5:30, Sat (Oct.-Mar.) 9:00-1:00 o Accepting [facility] [phone]) . [facility] at [facility] o [address] [place], [place] [address] o [phone] o Mon-Fri 8:00-5:00  [place] [phone]) . [facility] at [facility] o [address], [place], [place] [address] o [phone] o Mon-Fri 8:00-5:00 . Nature conservation officer at [place] o [address] [place], [place], [place] [address] o [phone] o Mon-Fri 8:00-5:00 . Nature conservation officer at [employer] o [address] [place], [place] [address] o [phone] o Mon-Fri 8:00-5:00 . [facility] o [address] [place] [address] o [phone] o Mon-Fri 7:30-5:30  [place] [phone] & [phone]) . [facility] o [address] [place], [place] [address] o [phone] o Mon-Thur 8:00-6:00 o Accepting Medicaid . [facility] Medicine o [address] [place], [place] [address] o [phone] o Mon-Thur 7:30-7:30, Fri 7:30-4:30 o Accepting Medicaid . [facility] at [facility] o [phone] [address] [place], [place]  [address] o [phone]   Fax - [phone]  [place] [phone] & [phone]) . Nature conservation officer at [employer] o [address] [place], [place] [address] o [phone] o Mon-Fri 7:00-5:00 . [facility] o [address], [place], [place] [address] o [phone] o Mon-Fri 8:00-5:00 o Accepting Medicaid . [facility] - [facility] o [address], [place], [place] [address] o [phone] o Mon-Fri 8:00-5:00 o Accepting Medicaid  [facility] [place] [phone]) . [facility] at [facility] o [address] [place], [place] [address] o [phone] o Mon-Fri 8:00-5:00 . [facility] - [facility] Medicine at [employer]) o [address], [place], [place] [address] o [phone] o Mon-Fri 8:00-5:00 o Accepting Medicaid . [facility] - [facility] at [employer]) o [address], [place], [place] [address] o [phone] o Mon-Fri 8:00-5:30, Sat&Sun by appointment (phones open at 8:30) o Accepting [facility] [phone] & [phone]) . [facility] Medicine o [address] [place], [place] [address] o [phone] o Mon-Thur 8:00-7:00, Fri 8:00-5:00,  Sat  8:00-12:00, Sun 9:00-12:00 o Accepting Medicaid . [facility] at [facility] [address], [place], [place] [address] o [phone] o Mon-Thur 8:00-5:00 o Accepting Medicaid . [facility] at [place] o [address] [place] [place], [place] [address] o [phone] o Mon-Fri 8:00-5:30, Sat (Oct.-Mar.) 9:00-1:00 o Accepting [employer] [phone]) . [facility] Medicine o [address] [place], [place] [address] o [phone] o Mon-Fri 8:00-5:00 o Accepting Medicaid   [facility] [phone]) . [facility] at [facility] o [address], [place], [place] [address] o [phone] o Mon-Fri 8:00-5:00 . Nature conservation officer at [facility] o [address], [place], [place] [address] o [phone] o Mon-Fri 8:00-5:00 . [facility] - [place] o [address], [place], [place] [address] o [phone] o Mon-Fri 8:00-5:00 o After hours clinic [facility] [place], [place] [address]) [phone] Mon-Fri 5:00-8:00, Sat 12:00-6:00, Sun 10:00-4:00 o Accepting Medicaid . [facility] at [facility] o [address] [place], [place]  [address] o [phone]   Fax - [phone]  [place] [phone]) . Nature conservation officer at [facility] o [address], [place], [place] [address] o [phone] o Mon-Fri 8:00-5:00 . [facility] Family [facility] - [place] [facility] at [place]) o [address] [place], [place] [address] o [phone] o Mon-Thur 8:00-7:00, Fri 8:00-5:00, Sat 8:00-12:00

## 2020-08-08 NOTE — BH Specialist Note (Signed)
Integrated Behavioral Health Initial In-Person Visit  MRN: [id number] Name: Erika Humphrey  Number of Integrated Behavioral Health Clinician visits:: 1/6 Session Start time: 2:32  Session End time: 2:45 Total time: 13 minutes  Types of Service: Introduction only  Interpretor:Yes.   Interpretor Name and Language: Pashto language   Warm Hand Off Completed.       Subjective: Erika Humphrey is a 25 y.o. female accompanied by Pt's sponsor Patient was referred by Mart Piggs, MD for introduction/mood check postpartum. Patient reports the following symptoms/concerns: Pt states her primary concern today is stomach pain and fatigue postpartum; pt says she is sleeping well, has regained appetite postpartum, and has no additional concerns.   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  Plan: 1. Follow up with behavioral health clinician on : [facility] Asher Muir will call within 2 weeks for additional mood check by phone 2. Behavioral recommendations:  -Continue prioritizing healthy sleeping and eating postpartum, as able -Continue with plan to take prenatal vitamins, as discussed with medical provider 3. Referral(s): Integrated [employer] (In Clinic) 4. "From scale of 1-10, how likely are you to follow plan?": -  Valetta Close Dillan Candela, LCSW   Edinburgh Postnatal Depression Scale Screening Tool  08/08/2020 06/17/2020  I have been able to laugh and see the funny side of things. 0 0  I have looked forward with enjoyment to things. 0 0  I have blamed myself unnecessarily when things went wrong. 0 0  I have been anxious or worried for no good reason. 0 0  I have felt scared or panicky for no good reason. 0 0  Things have been getting on top of me. 0 0  I have been so unhappy that I have had difficulty sleeping. 0 0  I have felt sad or miserable. 0 0  I have been so unhappy that I have been crying. 0 0  The thought of harming myself has occurred to me. 0 0  Edinburgh Postnatal Depression Scale Total 0 0      Depression screen [facility] 2/9 05/28/2020 05/21/2020 04/26/2020  Decreased Interest 0 0 0  Down, Depressed, Hopeless 0 0 0  PHQ - 2 Score 0 0 0  Altered sleeping 0 0 1  Tired, decreased energy 0 0 0  Change in appetite 0 0 2  Feeling bad or failure about yourself  0 0 0  Trouble concentrating 0 0 0  Moving slowly or fidgety/restless 0 0 0  Suicidal thoughts 0 0 0  PHQ-9 Score 0 0 3   GAD 7 : Generalized Anxiety Score 05/28/2020 05/21/2020 04/26/2020  Nervous, Anxious, on Edge 0 0 0  Control/stop worrying 0 0 0  Worry too much - different things 0 0 0  Trouble relaxing 0 0 0  Restless 0 0 0  Easily annoyed or irritable 0 0 0  Afraid - awful might happen 0 0 0  Total GAD 7 Score 0 0 0

## 2020-08-20 ENCOUNTER — Encounter: Payer: Medicaid Other | Admitting: Internal Medicine

## 2020-08-23 ENCOUNTER — Telehealth: Payer: Self-pay | Admitting: Clinical

## 2020-08-23 NOTE — Telephone Encounter (Signed)
Attempt to f/u with pt, as agreed-upon with Pt and [facility]; Difficulty finding Pashto interpreter; then interpreter [phone] Liall on phone and phone went blank; stayed on phone for awhile, for appeared call was cut off, so unable to reach pt today; will attempt call again next week.

## 2020-08-26 ENCOUNTER — Encounter: Payer: Medicaid Other | Admitting: Internal Medicine

## 2020-08-29 ENCOUNTER — Emergency Department (HOSPITAL_COMMUNITY): Payer: Medicaid Other

## 2020-08-29 ENCOUNTER — Telehealth: Payer: Self-pay | Admitting: *Deleted

## 2020-08-29 ENCOUNTER — Encounter (HOSPITAL_COMMUNITY): Payer: Self-pay | Admitting: Emergency Medicine

## 2020-08-29 ENCOUNTER — Encounter: Payer: Medicaid Other | Admitting: Internal Medicine

## 2020-08-29 ENCOUNTER — Other Ambulatory Visit: Payer: Self-pay

## 2020-08-29 ENCOUNTER — Emergency Department (HOSPITAL_COMMUNITY)
Admission: EM | Admit: 2020-08-29 | Discharge: 2020-08-29 | Disposition: A | Discharge status: Home / Self Care | Payer: Medicaid Other | Attending: Emergency Medicine | Admitting: Emergency Medicine

## 2020-08-29 DIAGNOSIS — G43809 Other migraine, not intractable, without status migrainosus: Secondary | ICD-10-CM

## 2020-08-29 DIAGNOSIS — R519 Headache, unspecified: Secondary | ICD-10-CM | POA: Diagnosis present

## 2020-08-29 LAB — COMPREHENSIVE METABOLIC PANEL
ALT: 36 U/L (ref 0–44)
AST: 27 U/L (ref 15–41)
Albumin: 3.8 g/dL (ref 3.5–5.0)
Alkaline Phosphatase: 55 U/L (ref 38–126)
Anion gap: 7 (ref 5–15)
BUN: 7 mg/dL (ref 6–20)
CO2: 22 mmol/L (ref 22–32)
Calcium: 8.9 mg/dL (ref 8.9–10.3)
Chloride: 108 mmol/L (ref 98–111)
Creatinine, Ser: 0.55 mg/dL (ref 0.44–1.00)
GFR, Estimated: >60 mL/min (ref >60)
Glucose, Bld: 90 mg/dL (ref 70–99)
Potassium: 3.6 mmol/L (ref 3.5–5.1)
Sodium: 137 mmol/L (ref 135–145)
Total Bilirubin: 0.5 mg/dL (ref 0.3–1.2)
Total Protein: 6.5 g/dL (ref 6.5–8.1)

## 2020-08-29 LAB — I-STAT BETA HCG BLOOD, ED (MC, WL, AP ONLY): I-stat hCG, quantitative: <5 m[IU]/mL (ref <5)

## 2020-08-29 LAB — URINALYSIS, ROUTINE W REFLEX MICROSCOPIC
Bilirubin Urine: NEGATIVE
Glucose, UA: NEGATIVE mg/dL
Hgb urine dipstick: NEGATIVE
Ketones, ur: NEGATIVE mg/dL
Nitrite: NEGATIVE
Protein, ur: NEGATIVE mg/dL
Specific Gravity, Urine: 1.008 (ref 1.005–1.030)
pH: 6 (ref 5.0–8.0)

## 2020-08-29 LAB — CBC WITH DIFFERENTIAL/PLATELET
Abs Immature Granulocytes: 0.01 10*3/uL (ref 0.00–0.07)
Basophils Absolute: 0 10*3/uL (ref 0.0–0.1)
Basophils Relative: 1 %
Eosinophils Absolute: 0.4 10*3/uL (ref 0.0–0.5)
Eosinophils Relative: 7 %
HCT: 34.1 % — ABNORMAL LOW (ref 36.0–46.0)
Hemoglobin: 10.9 g/dL — ABNORMAL LOW (ref 12.0–15.0)
Immature Granulocytes: 0 %
Lymphocytes Relative: 38 %
Lymphs Abs: 2.3 10*3/uL (ref 0.7–4.0)
MCH: 27.5 pg (ref 26.0–34.0)
MCHC: 32 g/dL (ref 30.0–36.0)
MCV: 85.9 fL (ref 80.0–100.0)
Monocytes Absolute: 0.6 10*3/uL (ref 0.1–1.0)
Monocytes Relative: 9 %
Neutro Abs: 2.8 10*3/uL (ref 1.7–7.7)
Neutrophils Relative %: 45 %
Platelets: 308 10*3/uL (ref 150–400)
RBC: 3.97 MIL/uL (ref 3.87–5.11)
RDW: 12.5 % (ref 11.5–15.5)
WBC: 6.1 10*3/uL (ref 4.0–10.5)
nRBC: 0 % (ref 0.0–0.2)

## 2020-08-29 LAB — RAPID URINE DRUG SCREEN, HOSP PERFORMED
Amphetamines: NOT DETECTED
Barbiturates: NOT DETECTED
Benzodiazepines: NOT DETECTED
Cocaine: NOT DETECTED
Opiates: NOT DETECTED
Tetrahydrocannabinol: NOT DETECTED

## 2020-08-29 LAB — LIPASE, BLOOD: Lipase: 26 U/L (ref 11–51)

## 2020-08-29 MED ORDER — PROCHLORPERAZINE EDISYLATE 10 MG/2ML IJ SOLN
10.0000 mg | Freq: Once | INTRAMUSCULAR | Status: AC
  Administered 2020-08-29: 10 mg via INTRAVENOUS
  Filled 2020-08-29: qty 2

## 2020-08-29 MED ORDER — DEXAMETHASONE SODIUM PHOSPHATE 10 MG/ML IJ SOLN
10.0000 mg | Freq: Once | INTRAMUSCULAR | Status: DC
  Filled 2020-08-29: qty 1

## 2020-08-29 MED ORDER — DIPHENHYDRAMINE HCL 50 MG/ML IJ SOLN
25.0000 mg | Freq: Once | INTRAMUSCULAR | Status: AC
  Administered 2020-08-29: 25 mg via INTRAVENOUS
  Filled 2020-08-29: qty 1

## 2020-08-29 MED ORDER — KETOROLAC TROMETHAMINE 15 MG/ML IJ SOLN
15.0000 mg | Freq: Once | INTRAMUSCULAR | Status: DC
  Filled 2020-08-29: qty 1

## 2020-08-29 MED ORDER — SODIUM CHLORIDE 0.9 % IV BOLUS
1000.0000 mL | Freq: Once | INTRAVENOUS | Status: AC
  Administered 2020-08-29: 1000 mL via INTRAVENOUS

## 2020-08-29 NOTE — Telephone Encounter (Signed)
Call from [place],pt's Refugee CM - stated pt passed out last night around 9PM, she did not go to the hospital  And passed out again just now, ambulance called and on the way to the hospital.

## 2020-08-29 NOTE — ED Triage Notes (Signed)
Patient BIB [facility] for syncope, possible seizure. Patient is afghan[place] refugee and speaks "pashto". Patient in no apparent distress at this time.

## 2020-08-29 NOTE — ED Notes (Signed)
Pt given opportunity for questions and answers were provided via interpreter. Pt given d/c instructions. Pt has left with social Investment banker, operational. Pt d/c from ED.

## 2020-08-29 NOTE — ED Triage Notes (Signed)
Pt reports headache and syncope x2. Per pt she had syncopal episodes last night and this afternoon. Pt denies complete LOC. States no CP or SOB. States vague abdominal pain for months. Has hx of headache/migrane. Reports weakness over last two days.

## 2020-08-29 NOTE — ED Provider Notes (Signed)
[facility] EMERGENCY DEPARTMENT Provider Note   CSN: [id number] Arrival date & time: 08/29/20  1502     History Chief Complaint  Patient presents with  . Migraine    Kyesha Milosevic is a 25 y.o. female.  Patient with history of headaches.  Worse headache that started yesterday.  Has made her feel really weak and lightheaded when she gets a headache.  No neck pain, no fever, no chills.  Patient denies any abdominal pain, chest pain, shortness of breath.  This is similar to migraines that she is had in the past.  No seizure history.  Never lost consciousness.  The history is provided by the patient. A language interpreter was used.  Migraine This is a new problem. The current episode started yesterday. The problem occurs hourly. The problem has been gradually worsening. Associated symptoms include headaches. Pertinent negatives include no chest pain, no abdominal pain and no shortness of breath. Nothing aggravates the symptoms. Nothing relieves the symptoms. She has tried nothing for the symptoms. The treatment provided no relief.       History reviewed. No pertinent past medical history.  Patient Active Problem List   Diagnosis Date Noted  . Indication for care in labor and delivery, antepartum 06/15/2020  . Size of fetus inconsistent with dates in third trimester 05/29/2020  . Fetal left hydronephrosis (43mm) noted on ultrasound, female fetus 05/14/2020  . Supervision of low-risk pregnancy, third trimester 04/26/2020  . Previous cesarean delivery, antepartum 04/26/2020  . Refugee status 04/26/2020    Past Surgical History:  Procedure Laterality Date  . CESAREAN SECTION       OB History    Gravida  2   Para  2   Term  2   Preterm  0   AB  0   Living  2     SAB  0   IAB  0   Ectopic  0   Multiple  0   Live Births  2           History reviewed. No pertinent family history.  Social History   Tobacco Use  . Smoking status: Never  Smoker  . Smokeless tobacco: Never Used  Vaping Use  . Vaping Use: Never used  Substance Use Topics  . Alcohol use: Never  . Drug use: Never    Home Medications Prior to Admission medications   Medication Sig Start Date End Date Taking? Authorizing Provider  docusate sodium (COLACE) 100 MG capsule Take 1 capsule (100 mg total) by mouth 2 (two) times daily as needed. Patient not taking: No sig reported 06/18/20   Warden Fillers, MD  ibuprofen (ADVIL) 600 MG tablet Take 1 tablet (600 mg total) by mouth every 6 (six) hours. Patient not taking: No sig reported 06/18/20   Warden Fillers, MD  norethindrone (MICRONOR) 0.35 MG tablet Take 1 tablet (0.35 mg total) by mouth daily. 08/08/20   Alric Seton, MD    Allergies    Patient has no known allergies.  Review of Systems   Review of Systems  Constitutional: Negative for chills and fever.  HENT: Negative for ear pain and sore throat.   Eyes: Negative for pain and visual disturbance.  Respiratory: Negative for cough and shortness of breath.   Cardiovascular: Negative for chest pain and palpitations.  Gastrointestinal: Negative for abdominal pain and vomiting.  Genitourinary: Negative for dysuria and hematuria.  Musculoskeletal: Negative for arthralgias and back pain.  Skin: Negative for color  change and rash.  Neurological: Positive for headaches. Negative for dizziness, tremors, seizures, syncope, facial asymmetry, speech difficulty, weakness, light-headedness and numbness.  All other systems reviewed and are negative.   Physical Exam Updated Vital Signs  ED Triage Vitals  Enc Vitals Group     BP 08/29/20 1530 112/70     Pulse Rate 08/29/20 1530 67     Resp 08/29/20 1530 12     Temp 08/29/20 1535 98 F (36.7 C)     Temp Source 08/29/20 1535 Oral     SpO2 08/29/20 1530 98 %     Weight --      Height --      Head Circumference --      Peak Flow --      Pain Score 08/29/20 1540 8     Pain Loc --      Pain Edu? --       Excl. in GC? --     Physical Exam Vitals and nursing note reviewed.  Constitutional:      General: She is not in acute distress.    Appearance: She is well-developed. She is not ill-appearing.  HENT:     Head: Normocephalic and atraumatic.     Mouth/Throat:     Mouth: Mucous membranes are moist.  Eyes:     Extraocular Movements: Extraocular movements intact.     Conjunctiva/sclera: Conjunctivae normal.     Pupils: Pupils are equal, round, and reactive to light.  Cardiovascular:     Rate and Rhythm: Normal rate and regular rhythm.     Pulses: Normal pulses.     Heart sounds: Normal heart sounds. No murmur heard.   Pulmonary:     Effort: Pulmonary effort is normal. No respiratory distress.     Breath sounds: Normal breath sounds.  Abdominal:     Palpations: Abdomen is soft.     Tenderness: There is no abdominal tenderness.  Musculoskeletal:     Cervical back: Normal range of motion and neck supple.  Skin:    General: Skin is warm and dry.     Capillary Refill: Capillary refill takes less than 2 seconds.  Neurological:     General: No focal deficit present.     Mental Status: She is alert and oriented to person, place, and time.     Cranial Nerves: No cranial nerve deficit.     Sensory: No sensory deficit.     Motor: No weakness.     Coordination: Coordination normal.     Gait: Gait normal.     Deep Tendon Reflexes: Reflexes normal.     Comments: 5+ out of 5 strength throughout, normal sensation, no drift, normal finger-to-nose finger, normal speech  Psychiatric:        Mood and Affect: Mood normal.     ED Results / Procedures / Treatments   Labs (all labs ordered are listed, but only abnormal results are displayed) Labs Reviewed  CBC WITH DIFFERENTIAL/PLATELET - Abnormal; Notable for the following components:      Result Value   Hemoglobin 10.9 (*)    HCT 34.1 (*)    All other components within normal limits  URINALYSIS, ROUTINE W REFLEX MICROSCOPIC -  Abnormal; Notable for the following components:   Color, Urine STRAW (*)    APPearance HAZY (*)    Leukocytes,Ua SMALL (*)    Bacteria, UA RARE (*)    All other components within normal limits  URINE CULTURE  COMPREHENSIVE METABOLIC PANEL  RAPID  URINE DRUG SCREEN, HOSP PERFORMED  LIPASE, BLOOD  I-STAT BETA HCG BLOOD, ED ([facility], [facility], [facility] ONLY)    EKG EKG Interpretation  Date/Time:  Thursday August 29 2020 15:24:38 EDT Ventricular Rate:  74 PR Interval:    QRS Duration: 79 QT Interval:  395 QTC Calculation: 439 R Axis:   93 Text Interpretation: Sinus rhythm Borderline right axis deviation Confirmed by Virgina Norfolk [phone]) on 08/29/2020 3:26:29 PM   Radiology CT Head Wo Contrast  Result Date: 08/29/2020 CLINICAL DATA:  Syncope.  Migraines. EXAM: CT HEAD WITHOUT CONTRAST TECHNIQUE: Contiguous axial images were obtained from the base of the skull through the vertex without intravenous contrast. COMPARISON:  None. FINDINGS: Brain: No evidence of acute large vascular territory infarction, hemorrhage, hydrocephalus, extra-axial collection or mass lesion/mass effect. Apparent hypodensity in the left temporal lobe is favored to relate to streak artifact off the sphenoid and temporal bones when correlating with coronal/sagittal imaging. Vascular: No hyperdense vessel identified. Skull: No acute fracture. Sinuses/Orbits: Clear sinuses.  Unremarkable orbits. Other: No mastoid effusions. IMPRESSION: No evidence of acute intracranial abnormality. Electronically Signed   By: Feliberto Harts MD   On: 08/29/2020 17:10    Procedures Procedures   Medications Ordered in ED Medications  ketorolac (TORADOL) 15 MG/ML injection 15 mg (has no administration in time range)  dexamethasone (DECADRON) injection 10 mg (has no administration in time range)  sodium chloride 0.9 % bolus 1,000 mL (1,000 mLs Intravenous New Bag/Given 08/29/20 1625)  prochlorperazine (COMPAZINE) injection 10 mg (10 mg Intravenous  Given 08/29/20 1622)  diphenhydrAMINE (BENADRYL) injection 25 mg (25 mg Intravenous Given 08/29/20 1623)    ED Course  I have reviewed the triage vital signs and the nursing notes.  Pertinent labs & imaging results that were available during my care of the patient were reviewed by me and considered in my medical decision making (see chart for details).    MDM Rules/Calculators/A&P                          Keyauna Joens is here with headache.  Normal vitals.  No fever.  No abnormal findings on neurological exam.  No concern for meningitis, no concern for subarachnoid.  CT scan of the head negative for head bleed.  No seizure activity.  Has had a bad headache over the last 2 days that has made her want to feel like she is in a pass out but she has not.  History of migraines.  Feels similar to prior.  Denies any abdominal pain, chest pain, shortness of breath.  Lab work showed no significant anemia, electrolyte abnormality, kidney injury.  Urinalysis negative for infection.  Overall suspect migraine type headache.  Felt better after IV fluids and headache cocktail.  Discharged home.  Head CT is normal.  Lab work unremarkable.  Feeling much better after headache cocktail.  Overall suspect migraine.  Discharged in good condition.  Understands return precautions.  This chart was dictated using voice recognition software.  Despite best efforts to proofread,  errors can occur which can change the documentation meaning.    Final Clinical Impression(s) / ED Diagnoses Final diagnoses:  Other migraine without status migrainosus, not intractable    Rx / DC Orders ED Discharge Orders    None       Virgina Norfolk, DO 08/29/20 1844

## 2020-08-29 NOTE — Telephone Encounter (Signed)
Thank you for letting me know

## 2020-08-31 LAB — URINE CULTURE: Culture: 80000 — AB

## 2020-09-04 ENCOUNTER — Telehealth: Payer: Self-pay | Admitting: Clinical

## 2020-09-04 NOTE — Telephone Encounter (Signed)
Attempted to call pt at both numbers on file. Voicemail was unable to be left at either number. -Erika Humphrey

## 2020-09-19 ENCOUNTER — Ambulatory Visit (INDEPENDENT_AMBULATORY_CARE_PROVIDER_SITE_OTHER): Payer: Medicaid Other | Admitting: Internal Medicine

## 2020-09-19 ENCOUNTER — Encounter: Payer: Self-pay | Admitting: Internal Medicine

## 2020-09-19 ENCOUNTER — Other Ambulatory Visit: Payer: Self-pay

## 2020-09-19 VITALS — BP 99/73 | HR 83 | Temp 98.1°F | Ht 64.0 in | Wt 116.7 lb

## 2020-09-19 DIAGNOSIS — R1013 Epigastric pain: Secondary | ICD-10-CM | POA: Insufficient documentation

## 2020-09-19 MED ORDER — SIMETHICONE 40 MG/0.6ML PO LIQD: 5.0000 mL | Freq: Every day | ORAL | 0 refills | Status: DC

## 2020-09-19 MED ORDER — FAMOTIDINE 20 MG PO TABS: 20.0000 mg | ORAL_TABLET | Freq: Two times a day (BID) | ORAL | 0 refills | Status: DC

## 2020-09-19 NOTE — Progress Notes (Signed)
This is a Psychologist, occupational Note.  The care of the patient was discussed with Dr. Mcarthur Rossetti and the assessment and plan was formulated with their assistance.  Please see their note for official documentation of the patient encounter.   Subjective:   Patient ID: Erika Humphrey female   DOB: [birth year] 25 y.o.   MRN: [id number]  HPI: [name] is a 25 y.o. female with an unspecified past medical history who presents today for establishment of care and for evaluation of postprandial abdominal bloating associated with shortness of breath. She reports that this has been constant for the past year and happens following every meal. Her symptoms are worse when she eats hard or solid foods. She is able to tolerate soft foods and liquids. Her symptoms started before her most recent pregnancy and remained unchanged over the course of the pregnancy and following birth of her child. She describes the sensation as a bloating sensation, a feeling of fullness, and pain in her stomach and chest area. She also reports pain in her back and shoulders. She has been evaluated for this problem by various physicians and they have given her many different medications but none of them have helped with her symptoms. She is unsure which medications she was given in the past and what tests were done. She does not recall anyone in her family having similar symptoms and is unsure about others in her village in [place]. She is not currently taking any medications for her symptoms. She reports feeling homesick when asked to further explain her shortness of breath. Denies difficulty swallowing or feeling like food is stuck in her throat. Denies fever, weight loss, chills, night sweats. Denies nausea, vomiting, diarrhea, and constipation.    No past medical history on file. Current Outpatient Medications  Medication Sig Dispense Refill  . famotidine (PEPCID) 20 MG tablet Take 1 tablet (20 mg total) by mouth 2 (two) times daily.  60 tablet 0  . Simethicone 40 MG/0.6ML LIQD Take 5 mLs (333.3333 mg total) by mouth daily. 30 mL 0  . docusate sodium (COLACE) 100 MG capsule Take 1 capsule (100 mg total) by mouth 2 (two) times daily as needed. (Patient not taking: No sig reported) 30 capsule 2  . ibuprofen (ADVIL) 600 MG tablet Take 1 tablet (600 mg total) by mouth every 6 (six) hours. (Patient not taking: No sig reported) 30 tablet 0  . norethindrone (MICRONOR) 0.35 MG tablet Take 1 tablet (0.35 mg total) by mouth daily. 28 tablet 11   No current facility-administered medications for this visit.   No family history on file. Social History   Socioeconomic History  . Marital status: Married    Spouse name: Not on file  . Number of children: Not on file  . Years of education: Not on file  . Highest education level: Not on file  Occupational History  . Not on file  Tobacco Use  . Smoking status: Never Smoker  . Smokeless tobacco: Never Used  Vaping Use  . Vaping Use: Never used  Substance and Sexual Activity  . Alcohol use: Never  . Drug use: Never  . Sexual activity: Yes  Other Topics Concern  . Not on file  Social History Narrative  . Not on file   Social Determinants of Health   Financial Resource Strain: Not on file  Food Insecurity: Food Insecurity Present  . Worried About Programme researcher, broadcasting/film/video in the Last Year: Sometimes true  . Ran Out of Food in  the Last Year: Often true  Transportation Needs: Unmet Transportation Needs  . Lack of Transportation (Medical): Yes  . Lack of Transportation (Non-Medical): Yes  Physical Activity: Not on file  Stress: Not on file  Social Connections: Not on file   Review of Systems: Pertinent items noted in HPI and remainder of comprehensive ROS otherwise negative. Objective:  Physical Exam: Vitals:   09/19/20 1341  BP: 99/73  Pulse: 83  Temp: 98.1 F (36.7 C)  TempSrc: Oral  SpO2: 99%  Weight: 116 lb 11.2 oz (52.9 kg)  Height: 5\' 4"  (1.626 m)   General:  Well appearing and in no acute distress, appears stated age Neuro: A&O x4, normal affect Cardiovascular: RRR, no m/r/g. Normal S1 and S2 without S3 or S4. Pulses 2+ in bilateral UE and LE. No peripheral edema Pulmonary: CTAB, no wheezes, rhonchi or rales. Normal WOB, no clubbing  Abdominal: Abdomen soft and mildly distended. Normoactive bowel sounds in all four quadrants. Tenderness to palpation in epigastric region. No tenderness to palpation of LLQ. Negative Murphy sign. No ascites. MSK: Normal ROM of all extremities. Reproducible tenderness to palpation on bilateral shoulders and trapezius muscle   Assessment & Plan:  Dyspepsia  Erika Humphrey is a 25 year old female from [place] presenting for evaluation of one year of postprandial abdominal bloating and fullness associated with shortness of breath and some pain radiating to the chest and back. Physical exam notable for tenderness to palpation of the epigastric region and mild abdominal distension. Highest on the differential is gastritis secondary to H.pylori infection given the symptoms of dyspepsia, duration, and immigration from an H.pylori endemic country. Also on the differential is GERD due to the epigastric pain radiating to the chest. Pancreatitis is less likely given the chronicity of the symptoms but cannot be ruled out given the abdominal pain radiating to the back. Our plan is to get an H.pylori stool antigen test, CBC, CMP, and lipase. We have also prescribed famotidine 20 mg and simethicone 48mL daily for symptomatic relief.

## 2020-09-19 NOTE — Assessment & Plan Note (Signed)
Erika Humphrey is a 25 year old female from [place] presenting for evaluation of one year of postprandial abdominal bloating and fullness associated with shortness of breath and some pain radiating to the chest and back. Physical exam notable for tenderness to palpation of the epigastric region and mild abdominal distension. Highest on the differential is gastritis secondary to H.pylori infection given the symptoms of dyspepsia, duration, and immigration from an H.pylori endemic country. Also on the differential is GERD due to the epigastric pain radiating to the chest. Pancreatitis is less likely given the chronicity of the symptoms but cannot be ruled out given the abdominal pain radiating to the back.   Plan:  - CBC, CMP, Lipase - H. Pylori stool antigen testing - Famotidine 20mg  bid + simethicone 74ml daily  - F/u in 4 weeks

## 2020-09-19 NOTE — Patient Instructions (Addendum)
Ms Erika Humphrey,  It was a pleasure seeing you in clinic. Today we discussed:   Abdominal pain: We are testing for a bacteria called H.pylori that may be causing your symptoms. Please provide a stool sample as soon as possible. We are also prescribing you Famotidine 20mg  twice daily and Simethicone 8ml daily for symptomatic relief.    If you have any questions or concerns, please call our clinic at [phone] between 9am-5pm and after hours call [phone] and ask for the internal medicine resident on call. If you feel you are having a medical emergency please call 911.   Thank you, we look forward to helping you remain healthy!    ? ? ~?   ~??~ ~?   ? ? .    ~?:   ? : ?  H.pylori    ~?  ? ~ ? ~   ?  .  ~? Z  Z     ~?. ?    ? ~?  ? Famotidine 20mg   Simethicone 5ml  ?  ?   ?? ~.   ~  ~ ? ? ??   ~? ? ~??~   [phone] ~?    9  ?  5  ? ?   ?   [phone]  ?      ?~   ? ~? ? ~?. ~   ~ ?   ??     ~? 911  ? .   ?     ? ~? ~? ?  ??   ?!

## 2020-09-20 LAB — CMP14 + ANION GAP
ALT: 26 IU/L (ref 0–32)
AST: 20 IU/L (ref 0–40)
Albumin/Globulin Ratio: 1.7 (ref 1.2–2.2)
Albumin: 4.6 g/dL (ref 3.9–5.0)
Alkaline Phosphatase: 74 IU/L (ref 44–121)
Anion Gap: 16 mmol/L (ref 10.0–18.0)
BUN/Creatinine Ratio: 17 (ref 9–23)
BUN: 9 mg/dL (ref 6–20)
Bilirubin Total: <0.2 mg/dL (ref 0.0–1.2)
CO2: 20 mmol/L (ref 20–29)
Calcium: 9.4 mg/dL (ref 8.7–10.2)
Chloride: 102 mmol/L (ref 96–106)
Creatinine, Ser: 0.53 mg/dL — ABNORMAL LOW (ref 0.57–1.00)
Globulin, Total: 2.7 g/dL (ref 1.5–4.5)
Glucose: 87 mg/dL (ref 65–99)
Potassium: 4.5 mmol/L (ref 3.5–5.2)
Sodium: 138 mmol/L (ref 134–144)
Total Protein: 7.3 g/dL (ref 6.0–8.5)
eGFR: 131 mL/min/{1.73_m2} (ref >59)

## 2020-09-20 LAB — CBC
Hematocrit: 35.2 % (ref 34.0–46.6)
Hemoglobin: 11.7 g/dL (ref 11.1–15.9)
MCH: 26.7 pg (ref 26.6–33.0)
MCHC: 33.2 g/dL (ref 31.5–35.7)
MCV: 80 fL (ref 79–97)
Platelets: 417 10*3/uL (ref 150–450)
RBC: 4.38 x10E6/uL (ref 3.77–5.28)
RDW: 12.3 % (ref 11.7–15.4)
WBC: 6.4 10*3/uL (ref 3.4–10.8)

## 2020-09-20 LAB — LIPASE: Lipase: 22 U/L (ref 14–72)

## 2020-09-23 NOTE — Progress Notes (Signed)
Internal Medicine Clinic Attending ° °Case discussed with Dr. Aslam  At the time of the visit.  We reviewed the resident’s history and exam and pertinent patient test results.  I agree with the assessment, diagnosis, and plan of care documented in the resident’s note.  °

## 2020-10-16 ENCOUNTER — Other Ambulatory Visit: Payer: Self-pay | Admitting: Internal Medicine

## 2020-10-21 ENCOUNTER — Telehealth: Payer: Self-pay | Admitting: *Deleted

## 2020-10-21 ENCOUNTER — Encounter: Payer: Medicaid Other | Admitting: Internal Medicine

## 2020-10-21 NOTE — Telephone Encounter (Signed)
Spoke with sponsor regarding patient no showed for this appointment. She is to call to reschedule this missed appointment [phone].

## 2020-10-29 ENCOUNTER — Encounter: Payer: Medicaid Other | Admitting: Internal Medicine

## 2020-11-07 ENCOUNTER — Ambulatory Visit (INDEPENDENT_AMBULATORY_CARE_PROVIDER_SITE_OTHER): Payer: Medicaid Other | Admitting: Internal Medicine

## 2020-11-07 ENCOUNTER — Other Ambulatory Visit: Payer: Self-pay

## 2020-11-07 VITALS — BP 107/60 | HR 89 | Temp 98.5°F | Ht 64.0 in | Wt 110.4 lb

## 2020-11-07 DIAGNOSIS — I10 Essential (primary) hypertension: Secondary | ICD-10-CM

## 2020-11-07 DIAGNOSIS — Z3490 Encounter for supervision of normal pregnancy, unspecified, unspecified trimester: Secondary | ICD-10-CM | POA: Insufficient documentation

## 2020-11-07 DIAGNOSIS — Z3A01 Less than 8 weeks gestation of pregnancy: Secondary | ICD-10-CM

## 2020-11-07 LAB — POCT URINE PREGNANCY: Preg Test, Ur: POSITIVE — AB

## 2020-11-07 MED ORDER — PRENATAL 27-0.8 MG PO TABS: 1.0000 | ORAL_TABLET | Freq: Every day | ORAL | 2 refills | Status: DC

## 2020-11-07 NOTE — Progress Notes (Signed)
   CC: Abdominal discomfort, pregnancy  HPI:  Erika Humphrey is a 25 y.o.  recent refugee with 2 children who is presenting for follow up of her dyspepsia and abdominal pain and discussion about recent positive pregnancy test.   No past medical history on file. Review of Systems:   Constitutional: Negative for chills and fever.  Respiratory: Negative for shortness of breath.   Cardiovascular: Negative for chest pain and leg swelling.  Gastrointestinal: Positive for abdominal pain, nausea and vomiting.  Neurological: Negative for dizziness and headaches.    Physical Exam:  Vitals:   11/07/20 1349  BP: 107/60  Pulse: 89  Temp: 98.5 F (36.9 C)  TempSrc: Oral  SpO2: 99%  Weight: 110 lb 6.4 oz (50.1 kg)  Height: 5\' 4"  (1.626 m)   Physical Exam HENT:     Head: Normocephalic and atraumatic.  Eyes:     Conjunctiva/sclera: Conjunctivae normal.     Pupils: Pupils are equal, round, and reactive to light.  Neck:     Thyroid: No thyromegaly.  Cardiovascular:     Rate and Rhythm: Normal rate and regular rhythm.     Heart sounds: Normal heart sounds. No murmur heard. No friction rub. No gallop.   Pulmonary:     Effort: Pulmonary effort is normal. No respiratory distress.     Breath sounds: Normal breath sounds. No wheezing.  Abdominal:     General: Bowel sounds are normal. There is no distension.     Palpations: Abdomen is soft.     Tenderness: There is no abdominal tenderness. There is no guarding or rebound.  Musculoskeletal:        General: Normal range of motion.     Cervical back: Normal range of motion and neck supple.  Skin:    General: Skin is warm and dry.     Findings: No erythema.  Neurological:     Mental Status: She is alert and oriented to person, place, and time.     Gait: Gait is intact.  Psychiatric:        Mood and Affect: Mood and affect normal.      Assessment & Plan:   See Encounters Tab for problem based charting.  Patient discussed with  Dr. 

## 2020-11-07 NOTE — Patient Instructions (Addendum)
Ms. Kendallyn Lippold,  It was a pleasure to see you today. Thank you for coming in.   Today we discussed your pregnancy. We are confirming it with a urine pregnancy test. Please start taking a prenatal vitamin medication, I have sent in a prescription or you can pick this up over the counter. I have referred you to the OB-GYN. I wish you a safe and healthy pregnancy!  Please return to clinic in 3 months or sooner if needed.   Thank you again for coming in.   Hermine Messick M.D.  ? ? ~?    ?? ?  ?  ?   ?.    .   ?     ?  ~?. ?     ??  ?? ~.  ~?  ??  ???  ? ? ~?     ~? ??? ? ?  ~?    ~? ? .   OB-GYN   ~?? ?.        ?!   ~?  3 ? ~? ?  ??   ~? Z  Z ~??~  .   ?  ? ? ? .  ? ~?~? ? ?

## 2020-11-08 NOTE — Assessment & Plan Note (Signed)
Last week she took child to doctor she was having abdominal pain and leaning over, started feeling nauseous so she took a pregnancy test and was positive. LMP was 2 months ago. She is having some abdominal discomfort and gas issues. Also endorses daily nausea and occasional NBNB emesis, as well as a headache. She reports having some dizziness when she is standing up too quickly. No fevers, chills, diarrhea, constipation, chest pain, shortness of breath, or other symptoms. She is taking an over the counter liquid to help with her nausea.  She reports she was not taking the mini-pill anymore.   Symptoms noted at her prior visit for dyspepsia, episgastric adominal pain, dysphagia, and abdominal bloating is likely 2/2 her pregnancy. Obtained orthostatics today which were positive, advised patient to increase fluid intake. Advised to try using vitamin B6 or ginger to help with nausea. Advised patient on medications that are safe during pregnancy and provided list.   -Refer to OB-GYN -Start prenatal vitamin -Confirmatory urine pregnancy test today -Advised to avoid ibruprofen and will remove from medication list

## 2020-11-12 ENCOUNTER — Telehealth: Payer: Self-pay

## 2020-11-12 NOTE — Telephone Encounter (Signed)
Return call to [place], pt's case worker - she asked about OB/GYN referral. Informed it's in the [facility] workque and we are waiting on a response.

## 2020-11-12 NOTE — Progress Notes (Signed)
Internal Medicine Clinic Attending ° °Case discussed with Dr. Krienke  At the time of the visit.  We reviewed the resident’s history and exam and pertinent patient test results.  I agree with the assessment, diagnosis, and plan of care documented in the resident’s note.  °

## 2020-11-12 NOTE — Telephone Encounter (Signed)
Refugee case worker Wyatt Mage  Would like a call back about pt

## 2020-11-13 NOTE — Telephone Encounter (Signed)
Thank you for giving that information.  If they would like to call to f/u with an appointment they can always call their office at the information listed below:   [facility] in [place], [place] Address: [address], [place], [place] [address]  Phone: [phone]

## 2020-11-19 ENCOUNTER — Telehealth: Payer: Self-pay

## 2020-11-19 NOTE — Telephone Encounter (Signed)
Pt's case manger requesting to speak with a nurse about getting nausea medication. States pt is not eating and drinking. Please call back.

## 2020-11-19 NOTE — Telephone Encounter (Signed)
Returned call to Case Worker. No answer. Left message on VM requesting return call.

## 2020-12-12 ENCOUNTER — Telehealth (INDEPENDENT_AMBULATORY_CARE_PROVIDER_SITE_OTHER): Payer: Medicaid Other

## 2020-12-12 DIAGNOSIS — O09891 Supervision of other high risk pregnancies, first trimester: Secondary | ICD-10-CM

## 2020-12-12 DIAGNOSIS — Z3A00 Weeks of gestation of pregnancy not specified: Secondary | ICD-10-CM

## 2020-12-12 DIAGNOSIS — Z136 Encounter for screening for cardiovascular disorders: Secondary | ICD-10-CM

## 2020-12-12 DIAGNOSIS — Z3490 Encounter for supervision of normal pregnancy, unspecified, unspecified trimester: Secondary | ICD-10-CM

## 2020-12-12 DIAGNOSIS — O0990 Supervision of high risk pregnancy, unspecified, unspecified trimester: Secondary | ICD-10-CM | POA: Insufficient documentation

## 2020-12-12 MED ORDER — BLOOD PRESSURE MONITORING DEVI: 1.0000 | 0 refills | Status: DC

## 2020-12-12 NOTE — Progress Notes (Signed)
New OB Intake  I connected with  Erika Humphrey on 12/12/20 at  2:15 PM EDT by telephone Video Visit and verified that I am speaking with the correct person using two identifiers. Nurse is located at [facility] and pt is located at home.  I discussed the limitations, risks, security and privacy concerns of performing an evaluation and management service by telephone and the availability of in person appointments. I also discussed with the patient that there may be a patient responsible charge related to this service. The patient expressed understanding and agreed to proceed.  I explained I am completing New OB Intake today. We discussed her EDD of 07/19/21 that is based on LMP of 10/12/20. Pt is G3/P2. I reviewed her allergies, medications, Medical/Surgical/OB history, and appropriate screenings. I informed her of [facility] services. Based on history, this is a/an  pregnancy complicated by Short Intervals between Pregnancies  .   Patient Active Problem List   Diagnosis Date Noted   Supervision of high risk pregnancy, antepartum 12/12/2020   Short interval between pregnancies affecting pregnancy in first trimester, antepartum 12/12/2020   Pregnant 11/07/2020   Previous cesarean delivery, antepartum 04/26/2020   Refugee status 04/26/2020    Concerns addressed today  Delivery Plans:  Plans to deliver at [facility].   MyChart/Babyscripts MyChart access verified. I explained pt will have some visits in office and some virtually. Babyscripts instructions given and order placed. Patient verifies receipt of registration text/e-mail. Account successfully created and app downloaded.  Blood Pressure Cuff  Blood pressure cuff ordered for patient to pick-up from [employer]. Explained after first prenatal appt pt will check weekly and document in Babyscripts.  Weight scale: Patient    have weight scale. Weight scale ordered   Anatomy US Explained first scheduled Korea will be around 19 weeks. Anatomy US  scheduled for 02/24/21 at 09:45a. Pt notified to arrive at 09:30a.  Labs Discussed Avelina Laine genetic screening with patient. Would like both Panorama and Horizon drawn at new OB visit. Routine prenatal labs needed.  Covid Vaccine Patient has covid vaccine.   Mother/ Baby Dyad Candidate?    If yes, offer as possibility  Inform patient of [facility] and place . In AVS   Social Determinants of Health Food Insecurity: Patient denies food insecurity. WIC Referral: Patient is interested in referral to [facility].  Transportation: Patient denies transportation needs. Childcare: Discussed no children allowed at ultrasound appointments. Offered childcare services; patient declines childcare services at this time.  First visit review I reviewed new OB appt with pt. I explained she will have a pelvic exam, ob bloodwork with genetic screening, and PAP smear. Explained pt will be seen by Dr.Newton at first visit; encounter routed to appropriate provider. Explained that patient will be seen by pregnancy navigator following visit with provider. [facility] information placed in AVS.   Henrietta Dine, CMA 12/12/2020  3:06 PM

## 2020-12-12 NOTE — Progress Notes (Signed)
Pt states has been having some dizziness & poor appetite. States that she has been taking Vit B-6 & Unisom.

## 2020-12-17 ENCOUNTER — Encounter: Payer: Self-pay | Admitting: *Deleted

## 2020-12-27 ENCOUNTER — Ambulatory Visit (INDEPENDENT_AMBULATORY_CARE_PROVIDER_SITE_OTHER): Payer: Medicaid Other | Admitting: Family Medicine

## 2020-12-27 ENCOUNTER — Encounter: Payer: Self-pay | Admitting: Family Medicine

## 2020-12-27 ENCOUNTER — Other Ambulatory Visit (HOSPITAL_COMMUNITY)
Admission: RE | Admit: 2020-12-27 | Discharge: 2020-12-27 | Disposition: A | Discharge status: Home / Self Care | Payer: Medicaid Other | Source: Ambulatory Visit | Attending: Family Medicine | Admitting: Family Medicine

## 2020-12-27 ENCOUNTER — Other Ambulatory Visit: Payer: Self-pay

## 2020-12-27 VITALS — BP 93/59 | HR 83 | Wt 104.8 lb

## 2020-12-27 DIAGNOSIS — O34219 Maternal care for unspecified type scar from previous cesarean delivery: Secondary | ICD-10-CM

## 2020-12-27 DIAGNOSIS — Z98891 History of uterine scar from previous surgery: Secondary | ICD-10-CM | POA: Insufficient documentation

## 2020-12-27 DIAGNOSIS — O99011 Anemia complicating pregnancy, first trimester: Secondary | ICD-10-CM

## 2020-12-27 DIAGNOSIS — O0990 Supervision of high risk pregnancy, unspecified, unspecified trimester: Secondary | ICD-10-CM

## 2020-12-27 NOTE — Progress Notes (Signed)
Unable to obtain FHT with doppler, will use bedside US.

## 2020-12-27 NOTE — Progress Notes (Signed)
INITIAL PRENATAL VISIT  Subjective:   Erika Humphrey is being seen today for her first obstetrical visit.  This is not a planned pregnancy. This is a desired pregnancy.  She is at [redacted]w[redacted]d gestation by LMP (knows month only). Her obstetrical history is significant for  Short interval, last delivery was in Feb 2022 by VAVD . Relationship with FOB: spouse, living together. Patient does intend to breast feed. Pregnancy history fully reviewed.  Patient reports no complaints.  Indications for ASA therapy (per uptodate) One of the following: Previous pregnancy with preeclampsia, especially early onset and with an adverse outcome No Multifetal gestation No Chronic hypertension No Type 1 or 2 diabetes mellitus No Chronic kidney disease No Autoimmune disease (antiphospholipid syndrome, systemic lupus erythematosus) No  Two or more of the following: Nulliparity No Obesity (body mass index >30 kg/m2) No Family history of preeclampsia in mother or sister No Age ?35 years No Sociodemographic characteristics (African American race, low socioeconomic level) No Personal risk factors (eg, previous pregnancy with low birth weight or small for gestational age infant, previous adverse pregnancy outcome [eg, stillbirth], interval >10 years between pregnancies) No  Indications for early GDM screening  First-degree relative with diabetes No BMI >30kg/m2 No Age > 25 Yes Previous birth of an infant weighing ?4000 g No Gestational diabetes mellitus in a previous pregnancy No Glycated hemoglobin ?5.7 percent (39 mmol/mol), impaired glucose tolerance, or impaired fasting glucose on previous testing No High-risk race/ethnicity (eg, African American, Latino, Native American, Asian American, Pacific Islander) No Previous stillbirth of unknown cause No Maternal birthweight > 9 lbs No History of cardiovascular disease No Hypertension or on therapy for hypertension No High-density lipoprotein cholesterol level  <35 mg/dL (7.82 mmol/L) and/or a triglyceride level >250 mg/dL (9.56 mmol/L) No Polycystic ovary syndrome No Physical inactivity No Other clinical condition associated with insulin resistance (eg, severe obesity, acanthosis nigricans) No Current use of glucocorticoids No   Early screening tests: FBS, A1C, Random CBG, glucose challenge   Review of Systems:   Review of Systems  Objective:    Obstetric History OB History  Gravida Para Term Preterm AB Living  3 2 2  0 0 2  SAB IAB Ectopic Multiple Live Births  0 0 0 0 2    # Outcome Date GA Lbr Len/2nd Weight Sex Delivery Anes PTL Lv  3 Current           2 Term 06/16/20 [redacted]w[redacted]d 22:17 / 05:56 6 lb 3.1 oz (2.81 kg) M VBAC, Vacuum EPI  LIV  1 Term 2019     CS-LTranv       No past medical history on file.  Past Surgical History:  Procedure Laterality Date   CESAREAN SECTION      Current Outpatient Medications on File Prior to Visit  Medication Sig Dispense Refill   Blood Pressure Monitoring DEVI 1 each by Does not apply route once a week. 1 each 0   Prenatal Vit-Fe Fumarate-FA (MULTIVITAMIN-PRENATAL) 27-0.8 MG TABS tablet Take 1 tablet by mouth daily at 12 noon. 30 tablet 2   No current facility-administered medications on file prior to visit.    No Known Allergies  Social History:  reports that she has never smoked. She has never used smokeless tobacco. She reports that she does not drink alcohol and does not use drugs.  No family history on file.  The following portions of the patient's history were reviewed and updated as appropriate: allergies, current medications, past family history, past medical history,  past social history, past surgical history and problem list.  Review of Systems Review of Systems  Constitutional:  Negative for chills and fever.  HENT:  Negative for congestion and sore throat.   Eyes:  Negative for pain and visual disturbance.  Respiratory:  Negative for cough, chest tightness and shortness of  breath.   Cardiovascular:  Negative for chest pain.  Gastrointestinal:  Negative for abdominal pain, diarrhea, nausea and vomiting.  Endocrine: Negative for cold intolerance and heat intolerance.  Genitourinary:  Negative for dysuria and flank pain.  Musculoskeletal:  Negative for back pain.  Skin:  Negative for rash.  Allergic/Immunologic: Negative for food allergies.  Neurological:  Negative for dizziness and light-headedness.  Psychiatric/Behavioral:  Negative for agitation.      Physical Exam:  BP (!) 93/59   Pulse 83   Wt 104 lb 12.8 oz (47.5 kg)   LMP 10/12/2020   BMI 17.99 kg/m  CONSTITUTIONAL: Well-developed, well-nourished female in no acute distress.  HENT:  Normocephalic, atraumatic, External right and left ear normal. Oropharynx is clear and moist EYES: Conjunctivae normal. No scleral icterus.  NECK: Normal range of motion, supple, no masses.  Normal thyroid.  SKIN: Skin is warm and dry. No rash noted. Not diaphoretic. No erythema. No pallor. MUSCULOSKELETAL: Normal range of motion. No tenderness.  No cyanosis, clubbing, or edema.   NEUROLOGIC: Alert and oriented to person, place, and time. Normal muscle tone coordination.  PSYCHIATRIC: Normal mood and affect. Normal behavior. Normal judgment and thought content. CARDIOVASCULAR: Normal heart rate noted, regular rhythm RESPIRATORY: Clear to auscultation bilaterally. Effort and breath sounds normal, no problems with respiration noted. BREASTS: Symmetric in size. No masses, skin changes, nipple drainage, or lymphadenopathy. ABDOMEN: Soft, normal bowel sounds, no distention noted.  No tenderness, rebound or guarding. Fundal ht: below pelvis PELVIC: Patient declined genital exam FHR: 140   Assessment:    Pregnancy: G1P0000 1. Supervision of high risk pregnancy, antepartum - Patient declined ANY vaginal exam  Counseled on recommendation for pap smear, she declined  Counseled that we will run GC/CT on urine  Declined  visual inspection of vagina/vulva - Declined genetic screening-- due to COST of NIPT - GC/Chlamydia probe amp ([place])not at [facility] - Culture, OB Urine - [facility] AMB BABYSCRIPTS SCHEDULE OPTIMIZATION - Hemoglobin A1c - CBC/D/Plt+RPR+Rh+ABO+RubIgG... - US OB Comp Less 14 Wks; Future  Utilized Pashto interpreter for visit   Plan:     Initial labs drawn. Prenatal vitamins. Problem list reviewed and updated. Reviewed in detail the nature of the practice with collaborative care between  Genetic screening discussed: NIPS  declined.  Open to QUAD - will obtain in the future. Role of ultrasound in pregnancy discussed; Anatomy US: requested. Amniocentesis discussed: not indicated. Follow up in 4 weeks. Discussed clinic routines, schedule of care and testing, genetic screening options, involvement of students and residents under the direct supervision of APPs and doctors and presence of female providers. Pt verbalized understanding.   Federico Flake, MD 12/27/2020 10:26 AM

## 2020-12-28 LAB — CBC/D/PLT+RPR+RH+ABO+RUBIGG...
Antibody Screen: NEGATIVE
Basophils Absolute: 0 10*3/uL (ref 0.0–0.2)
Basos: 1 %
EOS (ABSOLUTE): 0.2 10*3/uL (ref 0.0–0.4)
Eos: 4 %
HCV Ab: <0.1 s/co ratio (ref 0.0–0.9)
HIV Screen 4th Generation wRfx: NONREACTIVE
Hematocrit: 32.2 % — ABNORMAL LOW (ref 34.0–46.6)
Hemoglobin: 10.5 g/dL — ABNORMAL LOW (ref 11.1–15.9)
Hepatitis B Surface Ag: NEGATIVE
Immature Grans (Abs): 0 10*3/uL (ref 0.0–0.1)
Immature Granulocytes: 0 %
Lymphocytes Absolute: 2 10*3/uL (ref 0.7–3.1)
Lymphs: 32 %
MCH: 26.7 pg (ref 26.6–33.0)
MCHC: 32.6 g/dL (ref 31.5–35.7)
MCV: 82 fL (ref 79–97)
Monocytes Absolute: 0.3 10*3/uL (ref 0.1–0.9)
Monocytes: 5 %
Neutrophils Absolute: 3.6 10*3/uL (ref 1.4–7.0)
Neutrophils: 58 %
Platelets: 263 10*3/uL (ref 150–450)
RBC: 3.93 x10E6/uL (ref 3.77–5.28)
RDW: 15.8 % — ABNORMAL HIGH (ref 11.7–15.4)
RPR Ser Ql: NONREACTIVE
Rh Factor: POSITIVE
Rubella Antibodies, IGG: 4.56 index (ref >0.99)
WBC: 6.2 10*3/uL (ref 3.4–10.8)

## 2020-12-28 LAB — HCV INTERPRETATION

## 2020-12-28 LAB — HEMOGLOBIN A1C
Est. average glucose Bld gHb Est-mCnc: 94 mg/dL
Hgb A1c MFr Bld: 4.9 % (ref 4.8–5.6)

## 2020-12-30 LAB — CULTURE, OB URINE

## 2020-12-30 LAB — GC/CHLAMYDIA PROBE AMP (~~LOC~~) NOT AT ARMC
Chlamydia: NEGATIVE
Comment: NEGATIVE
Comment: NORMAL
Neisseria Gonorrhea: NEGATIVE

## 2020-12-30 LAB — URINE CULTURE, OB REFLEX

## 2020-12-31 ENCOUNTER — Encounter: Payer: Self-pay | Admitting: Family Medicine

## 2020-12-31 ENCOUNTER — Encounter: Payer: Self-pay | Admitting: *Deleted

## 2020-12-31 DIAGNOSIS — O99019 Anemia complicating pregnancy, unspecified trimester: Secondary | ICD-10-CM | POA: Insufficient documentation

## 2020-12-31 MED ORDER — FERROUS SULFATE 325 (65 FE) MG PO TABS: 325.0000 mg | ORAL_TABLET | ORAL | 11 refills | Status: DC

## 2020-12-31 NOTE — Addendum Note (Signed)
Addended by: Geanie Berlin on: 12/31/2020 02:50 PM   Modules accepted: Orders

## 2021-01-01 ENCOUNTER — Telehealth: Payer: Self-pay | Admitting: Internal Medicine

## 2021-01-01 ENCOUNTER — Telehealth: Payer: Self-pay | Admitting: General Practice

## 2021-01-01 NOTE — Telephone Encounter (Signed)
   Anija Brickner DOB: 1995/06/16 MRN: [id number]   RIDER WAIVER AND RELEASE OF LIABILITY  For purposes of improving physical access to our facilities, [facility] is pleased to partner with third parties to provide [place] patients or other authorized individuals the option of convenient, on-demand ground transportation services (the [employer]") through use of the technology service that enables users to request on-demand ground transportation from independent third-party providers.  By opting to use and accept these [employer], I, the undersigned, hereby agree on behalf of myself, and on behalf of any minor child using the Science writer for whom I am the parent or legal guardian, as follows:  Science writer provided to me are provided by independent third-party transportation providers who are not [employer] or employees and who are unaffiliated with [employer]. [facility] is neither a transportation carrier nor a common or public carrier. [facility] has no control over the quality or safety of the transportation that occurs as a result of the [employer]. [facility] cannot guarantee that any third-party transportation provider will complete any arranged transportation service. [facility] makes no representation, warranty, or guarantee regarding the reliability, timeliness, quality, safety, suitability, or availability of any of the Transport Services or that they will be error free. I fully understand that traveling by vehicle involves risks and dangers of serious bodily injury, including permanent disability, paralysis, and death. I agree, on behalf of myself and on behalf of any minor child using the Transport Services for whom I am the parent or legal guardian, that the entire risk arising out of my use of the [employer] remains solely with me, to the maximum extent permitted under applicable law. The [employer] are provided "as  is" and "as available." [facility] disclaims all representations and warranties, express, implied or statutory, not expressly set out in these terms, including the implied warranties of merchantability and fitness for a particular purpose. I hereby waive and release [facility], its agents, employees, officers, directors, representatives, insurers, attorneys, assigns, successors, subsidiaries, and affiliates from any and all past, present, or future claims, demands, liabilities, actions, causes of action, or suits of any kind directly or indirectly arising from acceptance and use of the [employer]. I further waive and release [place] and its affiliates from all present and future liability and responsibility for any injury or death to persons or damages to property caused by or related to the use of the [employer]. I have read this Waiver and Release of Liability, and I understand the terms used in it and their legal significance. This Waiver is freely and voluntarily given with the understanding that my right (as well as the right of any minor child for whom I am the parent or legal guardian using the [employer]) to legal recourse against [place] in connection with the [employer] is knowingly surrendered in return for use of these services.   I attest that I read the consent document to Beau Fanny, gave Ms. Hansen the opportunity to ask questions and answered the questions asked (if any). I affirm that Ramaya Guile then provided consent for she's participation in this program.     Erika Humphrey

## 2021-01-01 NOTE — Telephone Encounter (Signed)
-----   Message from Federico Flake, MD sent at 12/31/2020  2:49 PM EDT ----- WNL prenatal labs except mild anemia (hgb 10.5), Recommend starting oral iron supplement. Sent in prescription.

## 2021-01-01 NOTE — Telephone Encounter (Signed)
Called patient with pacific interpreter [phone] & informed her of results. Reviewed medication instructions with patient as well. Patient verbalized understanding and asked I call the sponsor to tell them she had a prescription ready because she doesn't know where to go. Told patient I would. Called Case Worker Rinaldo Cloud at [employer] listed & informed her patient had Rx ready for pickup at pharmacy. She verbalized understanding.

## 2021-01-02 ENCOUNTER — Ambulatory Visit: Admission: RE | Admit: 2021-01-02 | Payer: Medicaid Other | Source: Ambulatory Visit

## 2021-01-02 ENCOUNTER — Other Ambulatory Visit: Payer: Self-pay

## 2021-01-02 ENCOUNTER — Other Ambulatory Visit: Payer: Self-pay | Admitting: Family Medicine

## 2021-01-02 ENCOUNTER — Ambulatory Visit: Payer: Medicaid Other | Attending: Family Medicine

## 2021-01-02 DIAGNOSIS — O09892 Supervision of other high risk pregnancies, second trimester: Secondary | ICD-10-CM | POA: Diagnosis not present

## 2021-01-02 DIAGNOSIS — O09891 Supervision of other high risk pregnancies, first trimester: Secondary | ICD-10-CM

## 2021-01-02 DIAGNOSIS — O0990 Supervision of high risk pregnancy, unspecified, unspecified trimester: Secondary | ICD-10-CM | POA: Diagnosis present

## 2021-01-02 DIAGNOSIS — O99012 Anemia complicating pregnancy, second trimester: Secondary | ICD-10-CM

## 2021-01-02 DIAGNOSIS — Z3689 Encounter for other specified antenatal screening: Secondary | ICD-10-CM

## 2021-01-03 ENCOUNTER — Telehealth: Payer: Self-pay

## 2021-01-03 NOTE — Telephone Encounter (Signed)
-----   Message from Federico Flake, MD sent at 01/03/2021  9:26 AM EDT ----- Early Korea to establish EDD-- assigned based on this Korea. Needs repeat scan in 4 weeks to complete anatomy scan. This is already scheduled 02/24/21. I updated pregnancy episode to reflect the new due date.  Please call patient and assure she understands new EDD of 06/24/2021

## 2021-01-03 NOTE — Telephone Encounter (Signed)
Call placed to pt with [employer] Interpreter, no Pashto interpreter available at this time.   Will try again to call pt. Pt has next ROB appt on 8/12, can let pt know of new EDD at this time if unable to reach pt.   Judeth Cornfield, RN

## 2021-01-09 NOTE — Telephone Encounter (Signed)
Patient will be notified of new EDD and [place] appt at her next visit on 01/24/21.   Wynona Canes, [facility] 01/09/21

## 2021-01-24 ENCOUNTER — Encounter: Payer: Medicaid Other | Admitting: Certified Nurse Midwife

## 2021-01-29 ENCOUNTER — Encounter (HOSPITAL_COMMUNITY): Payer: Self-pay

## 2021-01-29 ENCOUNTER — Other Ambulatory Visit: Payer: Self-pay

## 2021-01-29 ENCOUNTER — Ambulatory Visit (HOSPITAL_COMMUNITY): Payer: Medicaid Other | Admitting: Licensed Clinical Social Worker
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
## 2021-01-31 ENCOUNTER — Other Ambulatory Visit: Payer: Self-pay

## 2021-01-31 ENCOUNTER — Ambulatory Visit (INDEPENDENT_AMBULATORY_CARE_PROVIDER_SITE_OTHER): Payer: Medicaid Other | Admitting: Family

## 2021-01-31 VITALS — BP 100/65 | HR 73 | Wt 113.1 lb

## 2021-01-31 DIAGNOSIS — O0990 Supervision of high risk pregnancy, unspecified, unspecified trimester: Secondary | ICD-10-CM

## 2021-01-31 DIAGNOSIS — Z1379 Encounter for other screening for genetic and chromosomal anomalies: Secondary | ICD-10-CM | POA: Diagnosis not present

## 2021-01-31 DIAGNOSIS — Z3A19 19 weeks gestation of pregnancy: Secondary | ICD-10-CM

## 2021-01-31 NOTE — Patient Instructions (Signed)
[facility] Address: [address] [place], [place] [address] Hours:  Open ? Closes 5:30PM Confirmed by this business 2 weeks ago Phone: [phone]

## 2021-02-02 LAB — AFP, SERUM, OPEN SPINA BIFIDA
AFP MoM: 1.3
AFP Value: 68.5 ng/mL
Gest. Age on Collection Date: 19.3 weeks
Maternal Age At EDD: 27.5 yr
OSBR Risk 1 IN: 1434
Test Results:: NEGATIVE
Weight: 113 [lb_av]

## 2021-02-10 ENCOUNTER — Encounter (HOSPITAL_COMMUNITY): Payer: Self-pay | Admitting: Obstetrics & Gynecology

## 2021-02-10 ENCOUNTER — Inpatient Hospital Stay (HOSPITAL_COMMUNITY)
Admission: EM | Admit: 2021-02-10 | Discharge: 2021-02-10 | Disposition: A | Discharge status: Home / Self Care | Payer: Medicaid Other | Attending: Obstetrics & Gynecology | Admitting: Obstetrics & Gynecology

## 2021-02-10 ENCOUNTER — Other Ambulatory Visit: Payer: Self-pay

## 2021-02-10 DIAGNOSIS — O26892 Other specified pregnancy related conditions, second trimester: Secondary | ICD-10-CM | POA: Diagnosis present

## 2021-02-10 DIAGNOSIS — O0990 Supervision of high risk pregnancy, unspecified, unspecified trimester: Secondary | ICD-10-CM

## 2021-02-10 DIAGNOSIS — O09891 Supervision of other high risk pregnancies, first trimester: Secondary | ICD-10-CM

## 2021-02-10 DIAGNOSIS — Z3A21 21 weeks gestation of pregnancy: Secondary | ICD-10-CM | POA: Diagnosis not present

## 2021-02-10 DIAGNOSIS — M549 Dorsalgia, unspecified: Secondary | ICD-10-CM

## 2021-02-10 DIAGNOSIS — O3432 Maternal care for cervical incompetence, second trimester: Secondary | ICD-10-CM | POA: Diagnosis not present

## 2021-02-10 DIAGNOSIS — O99012 Anemia complicating pregnancy, second trimester: Secondary | ICD-10-CM

## 2021-02-10 DIAGNOSIS — R109 Unspecified abdominal pain: Secondary | ICD-10-CM

## 2021-02-10 DIAGNOSIS — Z3A20 20 weeks gestation of pregnancy: Secondary | ICD-10-CM

## 2021-02-10 DIAGNOSIS — O209 Hemorrhage in early pregnancy, unspecified: Secondary | ICD-10-CM

## 2021-02-10 DIAGNOSIS — O4692 Antepartum hemorrhage, unspecified, second trimester: Secondary | ICD-10-CM | POA: Diagnosis not present

## 2021-02-10 LAB — URINALYSIS, ROUTINE W REFLEX MICROSCOPIC
Bilirubin Urine: NEGATIVE
Glucose, UA: NEGATIVE mg/dL
Ketones, ur: NEGATIVE mg/dL
Nitrite: NEGATIVE
Protein, ur: NEGATIVE mg/dL
RBC / HPF: >50 RBC/hpf — ABNORMAL HIGH (ref 0–5)
Specific Gravity, Urine: 1.028 (ref 1.005–1.030)
pH: 6 (ref 5.0–8.0)

## 2021-02-10 NOTE — MAU Note (Signed)
Erika Humphrey is a 25 y.o. at [redacted]w[redacted]d here in MAU reporting: abdominal pain, back pain, and vaginal bleeding for the past 2-3 hours. States bleeding was just when she wiped, no clots.   Onset of complaint: today  Pain score: abdomen 2/10, back 2/10  Vitals:   02/10/21 1500 02/10/21 1602  BP: 100/70 105/63  Pulse: (!) 108 99  Resp: 16 16  Temp: 97.6 F (36.4 C) 98.1 F (36.7 C)  SpO2: 96% 98%     FHT:145  Lab orders placed from triage: UA

## 2021-02-10 NOTE — ED Triage Notes (Signed)
Pt from home, [redacted] weeks pregnant, abdominal pain.

## 2021-02-10 NOTE — MAU Note (Signed)
Provider explained what a pelvic exam was and attempted  to do it. Pt had her stop. Did not do pelvic exam. No bleeding noted on exterior of vagina, nothing running out or obvious. Did not do any cultures or swabs. Had positive fetal heart tones and external abd exam was negative. Pt stated is is not having any pain at his time.

## 2021-02-10 NOTE — MAU Provider Note (Signed)
Patient Erika Humphrey is a 25 y.o. [id number] Here with complaints of small vaginal bleeding yesterday and some abdominal pain yesterday.  She reports c/section in the past, had a successful VBAC in January 2022.   Pashto stratus interpreter used for all patient encounters.   History     CSN: [id number]  Arrival date and time: 02/10/21 1452   None     Chief Complaint  Patient presents with   Abdominal Pain   Back Pain   Vaginal Bleeding   Abdominal Pain This is a new problem. The current episode started yesterday. The problem occurs 2 to 4 times per day. Pertinent negatives include no nausea or vomiting.  She reports some small amounts of blood.  OB History     Gravida  3   Para  2   Term  2   Preterm  0   AB  0   Living  2      SAB  0   IAB  0   Ectopic  0   Multiple  0   Live Births  2           History reviewed. No pertinent past medical history.  Past Surgical History:  Procedure Laterality Date   CESAREAN SECTION      No family history on file.  Social History   Tobacco Use   Smoking status: Never   Smokeless tobacco: Never  Vaping Use   Vaping Use: Never used  Substance Use Topics   Alcohol use: Never   Drug use: Never    Allergies: No Known Allergies  Medications Prior to Admission  Medication Sig Dispense Refill Last Dose   ferrous sulfate (FERROUSUL) 325 (65 FE) MG tablet Take 1 tablet (325 mg total) by mouth every other day. 30 tablet 11 Past Month   Prenatal Vit-Fe Fumarate-FA (MULTIVITAMIN-PRENATAL) 27-0.8 MG TABS tablet Take 1 tablet by mouth daily at 12 noon. 30 tablet 2 Past Month   Blood Pressure Monitoring DEVI 1 each by Does not apply route once a week. 1 each 0     Review of Systems  Respiratory: Negative.    Gastrointestinal:  Positive for abdominal pain. Negative for nausea and vomiting.  Genitourinary: Negative.   Neurological: Negative.   Physical Exam   Blood pressure 105/63, pulse 99, temperature 98.1 F  (36.7 C), temperature source Oral, resp. rate 16, weight 50 kg, last menstrual period 10/12/2020, SpO2 98 %, unknown if currently breastfeeding.  Physical Exam Constitutional:      Appearance: She is well-developed.  Abdominal:     General: Abdomen is flat.     Palpations: Abdomen is soft.  Neurological:     Mental Status: She is alert.  Patient abdomen is non-tender. Patient allowed me to position myself for speculum exam and patient had NFG with no blood on labia or inner thighs. No blood on sheets or pad. Attempted to place speculum and patient refused; did not proceed with exam.   MAU Course  Procedures  MDM -[facility] is 145 by Doppler  -Patient declined speculum exam and digital exam; explained to patient that the speculum exam and swabs can help determine the source of her bleeding and discomfort but patient does not want any exam or testing.   -Patient states that her bleeding has not happened at all today just in the morning and that her pain was yesterday. When asked "Do you have any concerns about your baby at this moment?", patient reports that she does not.   -  reviewed most recent US; patient's placenta was noted to be posterior.  Assessment and Plan   1. Supervision of high risk pregnancy, antepartum   2. Short interval between pregnancies affecting pregnancy in first trimester, antepartum   3. Anemia affecting pregnancy in second trimester   4. Vaginal bleeding before [redacted] weeks gestation     -Patient desires discharge; her sponsor was called.  -with pashto interpreter, explained to patient that she can return anytime if she is concerned about pain or bleeding or anything else about baby -reviewed upcoming appts with patient and dates and times Marylene Land 02/10/2021, 6:46 PM

## 2021-02-10 NOTE — Discharge Instructions (Addendum)
-  keep appt coming up on September 16 at 935 am

## 2021-02-10 NOTE — ED Provider Notes (Signed)
Emergency Medicine Provider OB Triage Evaluation Note  Erika Humphrey is a 25 y.o. female, G3P2002, at [redacted]w[redacted]d gestation who presents to the emergency department with complaints of bleeding and cramping. Speaks Pashto-interpret used  Review of  Systems  Positive: bleeding, cramping Negative: cp, sob, palpit  Physical Exam  BP 100/70 (BP Location: Left Arm)   Pulse (!) 108   Temp 97.6 F (36.4 C) (Oral)   Resp 16   LMP 10/12/2020   SpO2 96%  General: Awake, no distress  HEENT: Atraumatic  Resp: Normal effort  Cardiac: Normal rate Abd: Nondistended, nontender  MSK: Moves all extremities without difficulty Neuro: Speech clear  Medical Decision Making  Pt evaluated for pregnancy concern and is stable for transfer to MAU. Pt is in agreement with plan for transfer.  3:05 PM Discussed with MAU MD Adrian Blackwater, who accepts patient in transfer.  Clinical Impression  No diagnosis found.  Sent to MAU   Malakhai Beitler A, PA-C 02/10/21 1506    Gloris Manchester, MD 02/12/21 [phone]

## 2021-02-10 NOTE — ED Notes (Signed)
Pt transported to MAU  

## 2021-02-11 ENCOUNTER — Inpatient Hospital Stay (HOSPITAL_BASED_OUTPATIENT_CLINIC_OR_DEPARTMENT_OTHER): Payer: Medicaid Other

## 2021-02-11 ENCOUNTER — Inpatient Hospital Stay (HOSPITAL_COMMUNITY): Payer: Medicaid Other

## 2021-02-11 ENCOUNTER — Inpatient Hospital Stay (HOSPITAL_COMMUNITY)
Admission: AD | Admit: 2021-02-11 | Discharge: 2021-02-11 | Discharge status: Against Medical Advice | Payer: Medicaid Other | Attending: Obstetrics & Gynecology | Admitting: Obstetrics & Gynecology

## 2021-02-11 DIAGNOSIS — O3432 Maternal care for cervical incompetence, second trimester: Secondary | ICD-10-CM | POA: Diagnosis not present

## 2021-02-11 DIAGNOSIS — R109 Unspecified abdominal pain: Secondary | ICD-10-CM | POA: Insufficient documentation

## 2021-02-11 DIAGNOSIS — O99012 Anemia complicating pregnancy, second trimester: Secondary | ICD-10-CM | POA: Diagnosis not present

## 2021-02-11 DIAGNOSIS — O09892 Supervision of other high risk pregnancies, second trimester: Secondary | ICD-10-CM | POA: Diagnosis not present

## 2021-02-11 DIAGNOSIS — O26892 Other specified pregnancy related conditions, second trimester: Secondary | ICD-10-CM | POA: Insufficient documentation

## 2021-02-11 DIAGNOSIS — O0992 Supervision of high risk pregnancy, unspecified, second trimester: Secondary | ICD-10-CM | POA: Diagnosis not present

## 2021-02-11 DIAGNOSIS — Z98891 History of uterine scar from previous surgery: Secondary | ICD-10-CM | POA: Insufficient documentation

## 2021-02-11 DIAGNOSIS — Z3A21 21 weeks gestation of pregnancy: Secondary | ICD-10-CM | POA: Insufficient documentation

## 2021-02-11 DIAGNOSIS — O34219 Maternal care for unspecified type scar from previous cesarean delivery: Secondary | ICD-10-CM | POA: Diagnosis not present

## 2021-02-11 DIAGNOSIS — O4692 Antepartum hemorrhage, unspecified, second trimester: Secondary | ICD-10-CM | POA: Diagnosis not present

## 2021-02-11 DIAGNOSIS — O09891 Supervision of other high risk pregnancies, first trimester: Secondary | ICD-10-CM

## 2021-02-11 LAB — CBC WITH DIFFERENTIAL/PLATELET
Abs Immature Granulocytes: 0.01 10*3/uL (ref 0.00–0.07)
Basophils Absolute: 0 10*3/uL (ref 0.0–0.1)
Basophils Relative: 0 %
Eosinophils Absolute: 0.1 10*3/uL (ref 0.0–0.5)
Eosinophils Relative: 2 %
HCT: 32.4 % — ABNORMAL LOW (ref 36.0–46.0)
Hemoglobin: 10.7 g/dL — ABNORMAL LOW (ref 12.0–15.0)
Immature Granulocytes: 0 %
Lymphocytes Relative: 22 %
Lymphs Abs: 1.4 10*3/uL (ref 0.7–4.0)
MCH: 28.4 pg (ref 26.0–34.0)
MCHC: 33 g/dL (ref 30.0–36.0)
MCV: 85.9 fL (ref 80.0–100.0)
Monocytes Absolute: 0.7 10*3/uL (ref 0.1–1.0)
Monocytes Relative: 12 %
Neutro Abs: 4.1 10*3/uL (ref 1.7–7.7)
Neutrophils Relative %: 64 %
Platelets: 304 10*3/uL (ref 150–400)
RBC: 3.77 MIL/uL — ABNORMAL LOW (ref 3.87–5.11)
RDW: 13.2 % (ref 11.5–15.5)
WBC: 6.4 10*3/uL (ref 4.0–10.5)
nRBC: 0 % (ref 0.0–0.2)

## 2021-02-11 LAB — WET PREP, GENITAL
Clue Cells Wet Prep HPF POC: NONE SEEN
Sperm: NONE SEEN
Trich, Wet Prep: NONE SEEN
Yeast Wet Prep HPF POC: NONE SEEN

## 2021-02-11 MED ORDER — DOCUSATE SODIUM 100 MG PO CAPS: 100.0000 mg | ORAL_CAPSULE | Freq: Two times a day (BID) | ORAL | 2 refills | Status: DC

## 2021-02-11 NOTE — MAU Note (Signed)
Pt seen in MAU yesterday for vaginal bleeding and cramping. Today bleeding is heavier and c/o back pain and cramping much worse than yesterday.

## 2021-02-11 NOTE — MAU Provider Note (Signed)
Chief Complaint:  Vaginal Bleeding   Event Date/Time   First Provider Initiated Contact with Patient 02/11/21 1412     HPI: Erika Humphrey is a 25 y.o. G3P2002 at [redacted]w[redacted]d who presents to maternity admissions reporting Bleeding and mild low abd and low back cramping since yesterday 3/10 on pain scale. Came to MAU yesterday. Declined pelvic exam. Came back today because bleeding worsened. Bleeding is lighter than a period, bright red. Denies LOF, vaginal discharge, fever, chills, urinary complaints, Go complaints. Hx C/S, then [facility] 06/2020. Close pregnancy spacing. No previa per anatomy scan. Dated by 15 week [place].  Associated signs and symptoms: Neg for fever, chills, urinary complaints, GI complaints, vaginal discharge.  + fetal movement.   In-person interpreter used. Edward Qualia [phone]     Pregnancy Course:  Patient Active Problem List   Diagnosis Date Noted   [redacted] weeks gestation of pregnancy 01/31/2021   Anemia affecting pregnancy 12/31/2020   History of VBAC 12/27/2020   Supervision of high risk pregnancy, antepartum 12/12/2020   Short interval between pregnancies affecting pregnancy in first trimester, antepartum 12/12/2020   Previous cesarean delivery, antepartum 04/26/2020   Refugee status 04/26/2020     No past medical history on file. OB History  Gravida Para Term Preterm AB Living  3 2 2  0 0 2  SAB IAB Ectopic Multiple Live Births  0 0 0 0 2    # Outcome Date GA Lbr Len/2nd Weight Sex Delivery Anes PTL Lv  3 Current           2 Term 06/16/20 [redacted]w[redacted]d 22:17 / 05:56 2810 g M VBAC, Vacuum EPI  LIV  1 Term 2019     CS-LTranv      Past Surgical History:  Procedure Laterality Date   CESAREAN SECTION     No family history on file. Social History   Tobacco Use   Smoking status: Never   Smokeless tobacco: Never  Vaping Use   Vaping Use: Never used  Substance Use Topics   Alcohol use: Never   Drug use: Never   No Known Allergies No medications prior to  admission.    I have reviewed patient's Past Medical Hx, Surgical Hx, Family Hx, Social Hx, medications and allergies.   ROS:  Review of Systems  Constitutional:  Negative for chills and fever.  Respiratory:  Negative for shortness of breath.   Gastrointestinal:  Positive for abdominal pain. Negative for constipation, diarrhea, nausea and vomiting.  Genitourinary:  Positive for vaginal bleeding. Negative for vaginal discharge.  Musculoskeletal:  Positive for back pain.   Physical Exam  Patient Vitals for the past 24 hrs:  BP Temp Pulse Resp  02/11/21 1916 -- -- -- 17  02/11/21 1340 102/67 98.2 F (36.8 C) 95 18   Constitutional: Well-developed, underweight female in mild distress.  Cardiovascular: normal rate Respiratory: normal effort GI: Abd soft, non-tender, gravid appropriate for gestational age. Pos BS x 4 MS: Extremities nontender, no edema, normal ROM Neurologic: Alert and oriented x 4.  GU: Neg CVAT.  Pelvic: NEFG, physiologic discharge, small amount of bright red blood, cervix visually dilated 3-4 cm. BBOW hour-glassing through cervix. Feet visible. Digital exam deferred.     FHT:  145   Labs: Results for orders placed or performed during the hospital encounter of 02/11/21 (from the past 24 hour(s))  CBC with Differential/Platelet     Status: Abnormal   Collection Time: 02/11/21  2:24 PM  Result Value Ref Range   WBC 6.4  4.0 - 10.5 K/uL   RBC 3.77 (L) 3.87 - 5.11 MIL/uL   Hemoglobin 10.7 (L) 12.0 - 15.0 g/dL   HCT 40.9 (L) 81.1 - 91.4 %   MCV 85.9 80.0 - 100.0 fL   MCH 28.4 26.0 - 34.0 pg   MCHC 33.0 30.0 - 36.0 g/dL   RDW 78.2 95.6 - 21.3 %   Platelets 304 150 - 400 K/uL   nRBC 0.0 0.0 - 0.2 %   Neutrophils Relative % 64 %   Neutro Abs 4.1 1.7 - 7.7 K/uL   Lymphocytes Relative 22 %   Lymphs Abs 1.4 0.7 - 4.0 K/uL   Monocytes Relative 12 %   Monocytes Absolute 0.7 0.1 - 1.0 K/uL   Eosinophils Relative 2 %   Eosinophils Absolute 0.1 0.0 - 0.5 K/uL    Basophils Relative 0 %   Basophils Absolute 0.0 0.0 - 0.1 K/uL   Immature Granulocytes 0 %   Abs Immature Granulocytes 0.01 0.00 - 0.07 K/uL  Wet prep, genital     Status: Abnormal   Collection Time: 02/11/21  2:59 PM  Result Value Ref Range   Yeast Wet Prep HPF POC NONE SEEN NONE SEEN   Trich, Wet Prep NONE SEEN NONE SEEN   Clue Cells Wet Prep HPF POC NONE SEEN NONE SEEN   WBC, Wet Prep HPF POC MANY (A) NONE SEEN   Sperm NONE SEEN     Imaging:  [place] MFM OB FOLLOW UP  Result Date: 02/11/2021 ----------------------------------------------------------------------  OBSTETRICS REPORT                       (Signed Final 02/11/2021 05:37 pm) ---------------------------------------------------------------------- Patient Info  ID #:       [id number]                          D.O.B.:  April 22, 1996 (25 yrs)  Name:       Erika Humphrey                  Visit Date: 02/11/2021 02:15 pm ---------------------------------------------------------------------- Performed By  Attending:        Ma Rings MD         Secondary Phy.:   [facility]                                                             [facility]  Performed By:     Marcellina Millin       Address:          [address]                    [address]                                                             [place], [place]  [address]  Referred By:      Isa Rankin         [facility]                                [facility]  Ref. Address:     [address] ---------------------------------------------------------------------- Orders  #  Description                           Code        Ordered By  1  [place] MFM OB FOLLOW UP                   [id number]    Dorathy Kinsman ----------------------------------------------------------------------  #  Order #                     Accession #                Episode #  1  [id number]                    [id number]                 [id number] ---------------------------------------------------------------------- Indications  Vaginal bleeding in pregnancy, second          O46.92  trimester  [redacted] weeks gestation of pregnancy                Z3A.21  Short interval between pregancies, 2nd         O09.892  trimester (Last Delivery Date 06/16/2020)  History of cesarean delivery, currently        O34.219  pregnant x1  Anemia during pregnancy in second trimester    O99.012 ---------------------------------------------------------------------- Fetal Evaluation  Num Of Fetuses:         1  Fetal Heart Rate(bpm):  145  Cardiac Activity:       Observed  Presentation:           Breech  Placenta:               Posterior  P. Cord Insertion:      Previously Visualized  Amniotic Fluid  AFI FV:      Within normal limits  AFI Sum(cm)     %Tile       Largest Pocket(cm)  14.69           52          4.58  RUQ(cm)       RLQ(cm)       LUQ(cm)        LLQ(cm)  4.58          4.03          2.73           3.35 ---------------------------------------------------------------------- Biometry  BPD:      45.9  mm     G. Age:  19w 6d         11  %    CI:        66.27   %    70 -  86                                                          FL/HC:      19.1   %    15.9 - 20.3  HC:      180.8  mm     G. Age:  20w 3d         20  %    HC/AC:      1.08        1.06 - 1.25  AC:      167.8  mm     G. Age:  21w 6d         69  %    FL/BPD:     75.2   %  FL:       34.5  mm     G. Age:  20w 6d         36  %    FL/AC:      20.6   %    20 - 24  HUM:      33.7  mm     G. Age:  21w 3d         58  %  CER:      21.6  mm     G. Age:  20w 3d         41  %  LV:        5.8  mm  Est. FW:     408  gm    0 lb 14 oz      57  % ---------------------------------------------------------------------- OB History  Gravidity:    3         Term:   2        Prem:   0        SAB:   0  TOP:          0       Ectopic:  0        Living: 2  ---------------------------------------------------------------------- Gestational Age  U/S Today:     20w 5d                                        EDD:   06/26/21  Best:          [place] 0d     Det. By:  U/S  (01/02/21)          EDD:   06/24/21 ---------------------------------------------------------------------- Anatomy  Cranium:               Appears normal         Diaphragm:              Appears normal  Cavum:                 Appears normal         Stomach:                Appears normal, left  sided  Ventricles:            Appears normal         Kidneys:                Appear normal  Cerebellum:            Appears normal         Bladder:                Appears normal ---------------------------------------------------------------------- Cervix Uterus Adnexa  Cervix  Hourglass membranes into the vagina. ---------------------------------------------------------------------- Comments  This patient presented to the MAU due to vaginal bleeding.  It  appears that she presented to the MAU yesterday and  declined to be examined.  On today's ultrasound exam, the gestational sac appears to  be protruding beyond the cervix.  I was told that on speculum  exam, bulging membranes were noted in her vagina.  The overall EFW obtained today is 408 g.  There was normal  amniotic fluid noted.  As the gestational sac is noted in the vagina, this is most  likely an inevitable abortion.  The patient may be observed in the hospital for 24 hours.  Usually, patients with bulging membranes in the vagina may  go into spontaneous labor or rupture membranes or show  signs of an intrauterine infection at which time delivery would  be indicated.  At her current gestational age (21 weeks), the NICU will not  offer neonatal resuscitation.  A cervical cerclage may be considered to help improve her  outcome in a future pregnancy.  ----------------------------------------------------------------------                   Ma Rings, MD Electronically Signed Final Report   02/11/2021 05:37 pm ----------------------------------------------------------------------   MAU Course: Orders Placed This Encounter  Procedures   Wet prep, genital   [place] MFM OB FOLLOW UP   CBC with Differential/Platelet   Urinalysis, Routine w reflex microscopic   Diet NPO time specified   Bed rest   Consult to Transition of Care Team   Discharge patient   Meds ordered this encounter  Medications   docusate sodium (COLACE) 100 MG capsule    Sig: Take 1 capsule (100 mg total) by mouth 2 (two) times daily.    Dispense:  30 capsule    Refill:  2    Order Specific Question:   Supervising Provider    Answer:   Levie Heritage [[id number]]     MDM: - Preterm cervical dilation w/ hour-glassing membranes. Baby is pre-viable, 21 weeks dated by 15 week [place]. EFW 408 gm today. Not a candidate for BMZ or Cerclage. Lengthy conversation w/ pt about poor prognosis and previable fetus. Recommend admission since she is at high risk for delivery. Pt states her husband has to go to work and no one can watch her children (ages 8 months and 2 years) Pt says she cannot stay. Dr. Jolayne Panther came to [facility] to recommend admission and explained again in detail the high risk of delivery, infection, bleeding. Pt still insisting on leaving. SW called to further assess possible support options and pt still insisted on leaving AMA. CNM reiterated that pt can come back any time for admission and should call 911 for ROM, increased bleeding, pelvic pressure, fever, worsening abd pain and delivery. Pt verbalized understanding via interpreter.    Assessment: 1. Premature cervical dilation, second trimester   2. Vaginal bleeding in pregnancy, second trimester   3. Abdominal  pain during pregnancy in second trimester   4. Preterm labor in second trimester   5. Previous cesarean delivery,  antepartum   6. Short interval between pregnancies affecting pregnancy in first trimester, antepartum   7. History of VBAC   8. Anemia affecting pregnancy in second trimester   9. [redacted] weeks gestation of pregnancy     Plan: Left AMA Return precautions as above [facility] to check on pt tomorrow if she has not [facility].   Follow-up Information     [facility].   Specialty: Obstetrics and Gynecology Why: Will call you to schedule follow-up appointment Contact information: [address] [place] [phone]        [facility] Follow up.   Specialty: Obstetrics and Gynecology Why: As needed if symptoms worsen (worsening pain, bleeding, fever, leaking water, feeling like something is coming out of your vagina) Contact information: [address] [id number] [place] [phone]              Bedrest Stool softener to reduce straining.   Allergies as of 02/11/2021   No Known Allergies      Medication List     TAKE these medications    Blood Pressure Monitoring Devi 1 each by Does not apply route once a week.   docusate sodium 100 MG capsule Commonly known as: COLACE Take 1 capsule (100 mg total) by mouth 2 (two) times daily.   ferrous sulfate 325 (65 FE) MG tablet Commonly known as: FerrouSul Take 1 tablet (325 mg total) by mouth every other day.   multivitamin-prenatal 27-0.8 MG Tabs tablet Take 1 tablet by mouth daily at 12 noon.        Katrinka BlazingSmith, [place], [place] 02/11/2021 9:29 PM

## 2021-02-11 NOTE — MAU Note (Addendum)
error 

## 2021-02-11 NOTE — Social Work (Signed)
EDCSW responded to phone consult from MAU floor RN.   CSW met with Pt at bedside with interpreter present. Pt is stating that she wishes to leave AMA due to the fact that her 18 month old child is home with her husband and is sick.  CSW was present when Midwife reiterated medical concerns with current pregnancy. Through interpreter, Pt states that she will return to hospital after she has found someone to care for her other children, or if she experiences any possible symptoms tonight or tomorrow. Pt states that she understands how to use 911 system to call for an ambulance in case of an emergency. Pt reports that she wishes to leave hospital and go home AMA.

## 2021-02-11 NOTE — Discharge Instructions (Addendum)
Your have left the hospital against medical advice. You can come back at any time for admission. You are at high risk of delivering your baby at home. You may have severe bleeding or develop an infection . Your baby is too young to survive at this time but might have a chance of survival if you remain pregnant for at least 2 more weeks.

## 2021-02-12 ENCOUNTER — Other Ambulatory Visit: Payer: Self-pay

## 2021-02-12 ENCOUNTER — Other Ambulatory Visit (HOSPITAL_COMMUNITY): Payer: Self-pay

## 2021-02-12 LAB — GC/CHLAMYDIA PROBE AMP (~~LOC~~) NOT AT ARMC
Chlamydia: NEGATIVE
Comment: NEGATIVE
Comment: NORMAL
Neisseria Gonorrhea: NEGATIVE

## 2021-02-13 NOTE — Addendum Note (Signed)
Addended by: Elza Rafter on: 02/13/2021 09:31 AM   Modules accepted: Orders

## 2021-02-15 ENCOUNTER — Encounter (HOSPITAL_COMMUNITY): Payer: Self-pay | Admitting: Obstetrics and Gynecology

## 2021-02-15 ENCOUNTER — Other Ambulatory Visit: Payer: Self-pay

## 2021-02-15 ENCOUNTER — Inpatient Hospital Stay (HOSPITAL_COMMUNITY)
Admission: AD | Admit: 2021-02-15 | Discharge: 2021-02-17 | DRG: 805 | Disposition: A | Discharge status: Home / Self Care | Payer: Medicaid Other | Attending: Obstetrics and Gynecology | Admitting: Obstetrics and Gynecology

## 2021-02-15 DIAGNOSIS — Z789 Other specified health status: Secondary | ICD-10-CM

## 2021-02-15 DIAGNOSIS — Z3A21 21 weeks gestation of pregnancy: Secondary | ICD-10-CM

## 2021-02-15 DIAGNOSIS — O09891 Supervision of other high risk pregnancies, first trimester: Secondary | ICD-10-CM

## 2021-02-15 DIAGNOSIS — O9902 Anemia complicating childbirth: Secondary | ICD-10-CM

## 2021-02-15 DIAGNOSIS — O328XX0 Maternal care for other malpresentation of fetus, not applicable or unspecified: Secondary | ICD-10-CM | POA: Diagnosis present

## 2021-02-15 DIAGNOSIS — O34219 Maternal care for unspecified type scar from previous cesarean delivery: Secondary | ICD-10-CM | POA: Diagnosis present

## 2021-02-15 DIAGNOSIS — O3432 Maternal care for cervical incompetence, second trimester: Principal | ICD-10-CM | POA: Diagnosis present

## 2021-02-15 DIAGNOSIS — O99012 Anemia complicating pregnancy, second trimester: Secondary | ICD-10-CM

## 2021-02-15 DIAGNOSIS — O364XX1 Maternal care for intrauterine death, fetus 1: Secondary | ICD-10-CM

## 2021-02-15 DIAGNOSIS — Z20822 Contact with and (suspected) exposure to covid-19: Secondary | ICD-10-CM | POA: Diagnosis present

## 2021-02-15 DIAGNOSIS — O364XX0 Maternal care for intrauterine death, not applicable or unspecified: Secondary | ICD-10-CM | POA: Diagnosis present

## 2021-02-15 DIAGNOSIS — Z3490 Encounter for supervision of normal pregnancy, unspecified, unspecified trimester: Secondary | ICD-10-CM

## 2021-02-15 DIAGNOSIS — O4692 Antepartum hemorrhage, unspecified, second trimester: Secondary | ICD-10-CM

## 2021-02-15 LAB — CBC
HCT: 33.4 % — ABNORMAL LOW (ref 36.0–46.0)
Hemoglobin: 11.2 g/dL — ABNORMAL LOW (ref 12.0–15.0)
MCH: 28.8 pg (ref 26.0–34.0)
MCHC: 33.5 g/dL (ref 30.0–36.0)
MCV: 85.9 fL (ref 80.0–100.0)
Platelets: 345 10*3/uL (ref 150–400)
RBC: 3.89 MIL/uL (ref 3.87–5.11)
RDW: 12.8 % (ref 11.5–15.5)
WBC: 10.9 10*3/uL — ABNORMAL HIGH (ref 4.0–10.5)
nRBC: 0 % (ref 0.0–0.2)

## 2021-02-15 LAB — TYPE AND SCREEN
ABO/RH(D): A POS
Antibody Screen: NEGATIVE

## 2021-02-15 MED ORDER — ZOLPIDEM TARTRATE 5 MG PO TABS
5.0000 mg | ORAL_TABLET | Freq: Every evening | ORAL | Status: DC | PRN
  Administered 2021-02-16: 5 mg via ORAL
  Filled 2021-02-15: qty 1

## 2021-02-15 MED ORDER — CALCIUM CARBONATE ANTACID 500 MG PO CHEW: 2.0000 | CHEWABLE_TABLET | ORAL | Status: DC | PRN

## 2021-02-15 MED ORDER — ACETAMINOPHEN 325 MG PO TABS
650.0000 mg | ORAL_TABLET | ORAL | Status: DC | PRN
  Filled 2021-02-15: qty 2

## 2021-02-15 MED ORDER — LACTATED RINGERS IV SOLN: INTRAVENOUS | Status: DC

## 2021-02-15 MED ORDER — DOCUSATE SODIUM 100 MG PO CAPS: 100.0000 mg | ORAL_CAPSULE | Freq: Every day | ORAL | Status: DC

## 2021-02-15 MED ORDER — PRENATAL MULTIVITAMIN CH: 1.0000 | ORAL_TABLET | Freq: Every day | ORAL | Status: DC

## 2021-02-15 NOTE — MAU Note (Signed)
Not feeling well for a few days.

## 2021-02-15 NOTE — H&P (Signed)
Erika Humphrey is a 25 y.o. female G3P2002 at [redacted]w[redacted]d by 15 week [place], [facility] presenting for vaginal bleeding. She is admitted for hour-glassing membranes and advanced dilation.    She initially presented on 02/11/21 with bleeding and low back cramping and was found to be 3 cm dilated with hour-glassing membranes. Admission was recommended but pt was unable to stay and left AMA.  She presented today with increased bleeding.  There is still mild cramping in her back and low abdomen. She reports "I just don't feel well". She denies fever or chills.    Pregnancy complicated by cesarean x 1, successful VBAC, and short interval between pregnancies with previous delivery 8 months prior to today's admission.   OB History     Gravida  3   Para  2   Term  2   Preterm  0   AB  0   Living  2      SAB  0   IAB  0   Ectopic  0   Multiple  0   Live Births  2          History reviewed. No pertinent past medical history. Past Surgical History:  Procedure Laterality Date   CESAREAN SECTION     Family History: family history is not on file. Social History:  reports that she has never smoked. She has never used smokeless tobacco. She reports that she does not drink alcohol and does not use drugs.     Maternal Diabetes: No Genetic Screening: Normal Maternal Ultrasounds/Referrals: Normal Fetal Ultrasounds or other Referrals:  None Maternal Substance Abuse:  No Significant Maternal Medications:  None Significant Maternal Lab Results:  None Other Comments:  None  Review of Systems  Constitutional:  Negative for chills, fatigue and fever.  Respiratory:  Negative for shortness of breath.   Cardiovascular:  Negative for chest pain.  Gastrointestinal:  Positive for abdominal pain.  Genitourinary:  Positive for vaginal bleeding. Negative for difficulty urinating, dysuria, flank pain, pelvic pain, vaginal discharge and vaginal pain.  Musculoskeletal:  Positive for back pain.   Neurological:  Negative for dizziness and headaches.  Psychiatric/Behavioral: Negative.    Maternal Medical History:  Reason for admission: Vaginal bleeding.   Contractions: Frequency: irregular.   Perceived severity is mild.   Fetal activity: Perceived fetal activity is normal.   Prenatal complications: no prenatal complications Prenatal Complications - Diabetes: none.    Blood pressure (!) 102/59, pulse (!) 111, temperature 98.4 F (36.9 C), temperature source Oral, resp. rate 16, weight 50.3 kg, last menstrual period 10/12/2020, SpO2 100 %, unknown if currently breastfeeding. Maternal Exam:  Uterine Assessment: Contraction strength is mild.  Abdomen: Patient reports no abdominal tenderness. Cervix: Cervix evaluated by sterile speculum exam.   Cervix not visualized, bulging bag/hour-glassing membranes through cervix with fetal parts seen in sac.    Fetal Exam Fetal Monitor Review: Mode: ultrasound.   Baseline rate: 145 by doppler.   Physical Exam Vitals and nursing note reviewed.  Constitutional:      Appearance: She is well-developed.  Cardiovascular:     Rate and Rhythm: Normal rate and regular rhythm.     Heart sounds: Normal heart sounds.  Pulmonary:     Effort: Pulmonary effort is normal.  Abdominal:     Palpations: Abdomen is soft.  Musculoskeletal:        General: Normal range of motion.     Cervical back: Normal range of motion.  Skin:  General: Skin is warm and dry.  Neurological:     Mental Status: She is alert and oriented to person, place, and time.  Psychiatric:        Behavior: Behavior normal.        Thought Content: Thought content normal.        Judgment: Judgment normal.    Prenatal labs: ABO, Rh: A/Positive/-- (07/15 1009) Antibody: Negative (07/15 1009) Rubella: 4.56 (07/15 1009) RPR: Non Reactive (07/15 1009)  HBsAg: Negative (07/15 1009)  HIV: Non Reactive (07/15 1009)  GBS:   n/a  Assessment/Plan: 1. Short interval between  pregnancies affecting pregnancy in first trimester, antepartum   2. Anemia affecting pregnancy in second trimester   3. Premature cervical dilation, second trimester   4. Language barrier affecting health care   5. [redacted] weeks gestation of pregnancy   6. Vaginal bleeding in pregnancy, second trimester      Admit to [facility] Pashto interpreter Edward Qualia used via telephone for all communication Pt agreed to overnight stay but does not want to stay in the hospital for days, prefers to wait at home  Pad count for bleeding CNM will provide care due to pt preference for female provider IV, admission labs Observation   Sharen Counter 02/15/2021, 10:08 PM

## 2021-02-16 DIAGNOSIS — O328XX0 Maternal care for other malpresentation of fetus, not applicable or unspecified: Secondary | ICD-10-CM | POA: Diagnosis present

## 2021-02-16 DIAGNOSIS — Z20822 Contact with and (suspected) exposure to covid-19: Secondary | ICD-10-CM | POA: Diagnosis present

## 2021-02-16 DIAGNOSIS — O9902 Anemia complicating childbirth: Secondary | ICD-10-CM | POA: Diagnosis present

## 2021-02-16 DIAGNOSIS — Z3A21 21 weeks gestation of pregnancy: Secondary | ICD-10-CM | POA: Diagnosis not present

## 2021-02-16 DIAGNOSIS — O364XX1 Maternal care for intrauterine death, fetus 1: Secondary | ICD-10-CM | POA: Diagnosis not present

## 2021-02-16 DIAGNOSIS — O34219 Maternal care for unspecified type scar from previous cesarean delivery: Secondary | ICD-10-CM | POA: Diagnosis present

## 2021-02-16 DIAGNOSIS — O3432 Maternal care for cervical incompetence, second trimester: Secondary | ICD-10-CM | POA: Diagnosis present

## 2021-02-16 DIAGNOSIS — Z3490 Encounter for supervision of normal pregnancy, unspecified, unspecified trimester: Secondary | ICD-10-CM

## 2021-02-16 DIAGNOSIS — O364XX0 Maternal care for intrauterine death, not applicable or unspecified: Secondary | ICD-10-CM | POA: Diagnosis present

## 2021-02-16 DIAGNOSIS — O4692 Antepartum hemorrhage, unspecified, second trimester: Secondary | ICD-10-CM | POA: Diagnosis present

## 2021-02-16 LAB — CBC
HCT: 31.1 % — ABNORMAL LOW (ref 36.0–46.0)
Hemoglobin: 10.3 g/dL — ABNORMAL LOW (ref 12.0–15.0)
MCH: 28.4 pg (ref 26.0–34.0)
MCHC: 33.1 g/dL (ref 30.0–36.0)
MCV: 85.7 fL (ref 80.0–100.0)
Platelets: 303 10*3/uL (ref 150–400)
RBC: 3.63 MIL/uL — ABNORMAL LOW (ref 3.87–5.11)
RDW: 12.7 % (ref 11.5–15.5)
WBC: 11.4 10*3/uL — ABNORMAL HIGH (ref 4.0–10.5)
nRBC: 0 % (ref 0.0–0.2)

## 2021-02-16 LAB — RESP PANEL BY RT-PCR (FLU A&B, COVID) ARPGX2
Influenza A by PCR: NEGATIVE
Influenza B by PCR: NEGATIVE
SARS Coronavirus 2 by RT PCR: NEGATIVE

## 2021-02-16 LAB — RPR: RPR Ser Ql: NONREACTIVE

## 2021-02-16 MED ORDER — OXYCODONE-ACETAMINOPHEN 5-325 MG PO TABS: 1.0000 | ORAL_TABLET | ORAL | Status: DC | PRN

## 2021-02-16 MED ORDER — BETAMETHASONE SOD PHOS & ACET 6 (3-3) MG/ML IJ SUSP
12.0000 mg | Freq: Once | INTRAMUSCULAR | Status: AC
  Administered 2021-02-16: 12 mg via INTRAMUSCULAR
  Filled 2021-02-16: qty 5

## 2021-02-16 MED ORDER — OXYTOCIN-SODIUM CHLORIDE 30-0.9 UT/500ML-% IV SOLN
2.5000 [IU]/h | INTRAVENOUS | Status: DC
  Filled 2021-02-16: qty 500

## 2021-02-16 MED ORDER — FLEET ENEMA 7-19 GM/118ML RE ENEM: 1.0000 | ENEMA | RECTAL | Status: DC | PRN

## 2021-02-16 MED ORDER — ONDANSETRON HCL 4 MG/2ML IJ SOLN
4.0000 mg | Freq: Four times a day (QID) | INTRAMUSCULAR | Status: DC | PRN
  Administered 2021-02-16: 4 mg via INTRAVENOUS
  Filled 2021-02-16: qty 2

## 2021-02-16 MED ORDER — LACTATED RINGERS IV SOLN
500.0000 mL | INTRAVENOUS | Status: DC | PRN
  Administered 2021-02-16: 500 mL via INTRAVENOUS

## 2021-02-16 MED ORDER — ACETAMINOPHEN 325 MG PO TABS: 650.0000 mg | ORAL_TABLET | ORAL | Status: DC | PRN

## 2021-02-16 MED ORDER — OXYTOCIN BOLUS FROM INFUSION
333.0000 mL | Freq: Once | INTRAVENOUS | Status: DC
  Administered 2021-02-17: 333 mL via INTRAVENOUS

## 2021-02-16 MED ORDER — FENTANYL CITRATE (PF) 100 MCG/2ML IJ SOLN
100.0000 ug | INTRAMUSCULAR | Status: DC | PRN
  Administered 2021-02-16 – 2021-02-17 (×6): 100 ug via INTRAVENOUS
  Filled 2021-02-16 (×7): qty 2

## 2021-02-16 MED ORDER — LIDOCAINE HCL (PF) 1 % IJ SOLN
30.0000 mL | INTRAMUSCULAR | Status: DC | PRN
Start: 2021-02-16 — End: 2021-02-17

## 2021-02-16 MED ORDER — SOD CITRATE-CITRIC ACID 500-334 MG/5ML PO SOLN: 30.0000 mL | ORAL | Status: DC | PRN

## 2021-02-16 MED ORDER — LACTATED RINGERS IV SOLN: INTRAVENOUS | Status: DC

## 2021-02-16 MED ORDER — OXYCODONE-ACETAMINOPHEN 5-325 MG PO TABS: 2.0000 | ORAL_TABLET | ORAL | Status: DC | PRN

## 2021-02-16 NOTE — Progress Notes (Signed)
Erika Humphrey is a 25 y.o. G3P2002 at [redacted]w[redacted]d.  Subjective: Increased bleeding.   Objective: BP 125/69 (BP Location: Right Arm)   Pulse 96   Temp 98.1 F (36.7 C) (Oral)   Resp 18   Wt 50.3 kg   LMP 10/12/2020   SpO2 100%   BMI 19.05 kg/m    FHT:  FHR: 155 bpm by doppler UC:  Increased   Labs: Results for orders placed or performed during the hospital encounter of 02/15/21 (from the past 24 hour(s))  CBC     Status: Abnormal   Collection Time: 02/15/21 11:00 PM  Result Value Ref Range   WBC 10.9 (H) 4.0 - 10.5 K/uL   RBC 3.89 3.87 - 5.11 MIL/uL   Hemoglobin 11.2 (L) 12.0 - 15.0 g/dL   HCT 26.9 (L) 48.5 - 46.2 %   MCV 85.9 80.0 - 100.0 fL   MCH 28.8 26.0 - 34.0 pg   MCHC 33.5 30.0 - 36.0 g/dL   RDW 70.3 50.0 - 93.8 %   Platelets 345 150 - 400 K/uL   nRBC 0.0 0.0 - 0.2 %  Type and screen     Status: None   Collection Time: 02/15/21 11:00 PM  Result Value Ref Range   ABO/RH(D) A POS    Antibody Screen NEG    Sample Expiration      02/18/2021,2359 Performed at [facility], [address] [place], [place] [address]   Resp Panel by RT-PCR (Flu A&B, Covid) Nasopharyngeal Swab     Status: None   Collection Time: 02/15/21 11:10 PM   Specimen: Nasopharyngeal Swab; Nasopharyngeal(NP) swabs in vial transport medium  Result Value Ref Range   SARS Coronavirus 2 by RT PCR NEGATIVE NEGATIVE   Influenza A by PCR NEGATIVE NEGATIVE   Influenza B by PCR NEGATIVE NEGATIVE    Assessment / Plan: [redacted]w[redacted]d week IUP Labor: Delivery possibly imminent. Fetal parts palpable in bag per previous CNM Fetal Wellbeing:  + FHTs Pain Control:  Fentanyl Anticipated MOD:  SVD Discussed imminent delivery w/ Dr. Alysia Penna, and Neonatologists, that dating is by 15 week [place] and EFW 408 7 days ago. Have already prepared pt that baby is most definitely pre-viable, but will plan to have NICU present for delivery to assess. BMZ ordered per discussion w/ Dr. Akela Pocius [place].    Erika Humphrey, [place], CNM 02/16/2021 8:41  AM

## 2021-02-16 NOTE — Progress Notes (Signed)
Patient ID: Erika Humphrey, female   DOB: 05-21-1996, 25 y.o.   MRN: [id number]  FACULTY PRACTICE ANTEPARTUM COMPREHENSIVE PROGRESS NOTE  Erika Humphrey is a 25 y.o. G3P2002 at [redacted]w[redacted]d who is admitted for advanced dilation and hour-glassing amniotic membranes.  Estimated Date of Delivery: 06/24/21 Fetal presentation is cephalic.  Length of Stay:  0 Days. Admitted 02/15/2021  Subjective: Called to room by RN for pt report of pain and desire for pain medication.  Language line with Pashto language used for all communication.  Unable to reach pt interpreter who was present earlier last night.  Pt reports cramping pain low in her abdomen and low back.  Patient reports normal fetal movement.  Vitals:  Blood pressure 121/64, pulse 97, temperature 98.1 F (36.7 C), temperature source Oral, resp. rate 18, weight 50.3 kg, last menstrual period 10/12/2020, SpO2 100 %, unknown if currently breastfeeding. Physical Examination: CONSTITUTIONAL: Well-developed, well-nourished female in no acute distress.  HENT:  Normocephalic, atraumatic, External right and left ear normal. Oropharynx is clear and moist EYES: Conjunctivae and EOM are normal. Pupils are equal, round, and reactive to light. No scleral icterus.  NECK: Normal range of motion, supple, no masses SKIN: Skin is warm and dry. No rash noted. Not diaphoretic. No erythema. No pallor. NEUROLGIC: Alert and oriented to person, place, and time. Normal reflexes, muscle tone coordination. No cranial nerve deficit noted. PSYCHIATRIC: Normal mood and affect. Normal behavior. Normal judgment and thought content. CARDIOVASCULAR: Normal heart rate noted, regular rhythm RESPIRATORY: Effort and breath sounds normal, no problems with respiration noted MUSCULOSKELETAL: Normal range of motion. No edema and no tenderness. 2+ distal pulses. ABDOMEN: Soft, nontender, nondistended, gravid. CERVIX: Dilation:  (unchanged) Exam by:: Leftwich-Kirby  Fetal monitoring: FHR: 155  bpm by doppler Uterine activity: pt reports intermittent cramping, worsening since onset  Results for orders placed or performed during the hospital encounter of 02/15/21 (from the past 48 hour(s))  CBC     Status: Abnormal   Collection Time: 02/15/21 11:00 PM  Result Value Ref Range   WBC 10.9 (H) 4.0 - 10.5 K/uL   RBC 3.89 3.87 - 5.11 MIL/uL   Hemoglobin 11.2 (L) 12.0 - 15.0 g/dL   HCT 38.1 (L) 01.7 - 51.0 %   MCV 85.9 80.0 - 100.0 fL   MCH 28.8 26.0 - 34.0 pg   MCHC 33.5 30.0 - 36.0 g/dL   RDW 25.8 52.7 - 78.2 %   Platelets 345 150 - 400 K/uL   nRBC 0.0 0.0 - 0.2 %    Comment: Performed at [facility], [address], [place], [place] [address]  Type and screen     Status: None   Collection Time: 02/15/21 11:00 PM  Result Value Ref Range   ABO/RH(D) A POS    Antibody Screen NEG    Sample Expiration      02/18/2021,2359 Performed at [facility], [address], [place], [place] [address]   Resp Panel by RT-PCR (Flu A&B, Covid) Nasopharyngeal Swab     Status: None   Collection Time: 02/15/21 11:10 PM   Specimen: Nasopharyngeal Swab; Nasopharyngeal(NP) swabs in vial transport medium  Result Value Ref Range   SARS Coronavirus 2 by RT PCR NEGATIVE NEGATIVE    Comment: (NOTE) SARS-CoV-2 target nucleic acids are NOT DETECTED.  The SARS-CoV-2 RNA is generally detectable in upper respiratory specimens during the acute phase of infection. The lowest concentration of SARS-CoV-2 viral copies this assay can detect is 138 copies/mL. A negative  result does not preclude SARS-Cov-2 infection and should not be used as the sole basis for treatment or other patient management decisions. A negative result may occur with  improper specimen collection/handling, submission of specimen other than nasopharyngeal swab, presence of viral mutation(s) within the areas targeted by this assay, and inadequate number of viral copies(<138 copies/mL). A negative result must be combined  with clinical observations, patient history, and epidemiological information. The expected result is Negative.  Fact Sheet for Patients:  BloggerCourse.com  Fact Sheet for Healthcare Providers:  SeriousBroker.it  This test is no t yet approved or cleared by the [place] FDA and  has been authorized for detection and/or diagnosis of SARS-CoV-2 by FDA under an Emergency Use Authorization (EUA). This EUA will remain  in effect (meaning this test can be used) for the duration of the COVID-19 declaration under Section 564(b)(1) of the Act, 21 U.S.C.section 360bbb-3(b)(1), unless the authorization is terminated  or revoked sooner.       Influenza A by PCR NEGATIVE NEGATIVE   Influenza B by PCR NEGATIVE NEGATIVE    Comment: (NOTE) The Xpert Xpress SARS-CoV-2/FLU/RSV plus assay is intended as an aid in the diagnosis of influenza from Nasopharyngeal swab specimens and should not be used as a sole basis for treatment. Nasal washings and aspirates are unacceptable for Xpert Xpress SARS-CoV-2/FLU/RSV testing.  Fact Sheet for Patients: BloggerCourse.com  Fact Sheet for Healthcare Providers: SeriousBroker.it  This test is not yet approved or cleared by the [place] FDA and has been authorized for detection and/or diagnosis of SARS-CoV-2 by FDA under an Emergency Use Authorization (EUA). This EUA will remain in effect (meaning this test can be used) for the duration of the COVID-19 declaration under Section 564(b)(1) of the Act, 21 U.S.C. section 360bbb-3(b)(1), unless the authorization is terminated or revoked.  Performed at [facility], [address], [place], [place] [address]     No results found.  Current scheduled medications  docusate sodium  100 mg Oral Daily   prenatal multivitamin  1 tablet Oral Q1200    I have reviewed the patient's current  medications.  ASSESSMENT: 1. Short interval between pregnancies affecting pregnancy in first trimester, antepartum   2. Anemia affecting pregnancy in second trimester   3. Premature cervical dilation, second trimester   4. Language barrier affecting health care   5. [redacted] weeks gestation of pregnancy   6. Vaginal bleeding in pregnancy, second trimester          PLAN: Continue current care. Cervix essentially unchanged, no palpable cervix with funneling membranes, fetal parts and membranes through cervix but not to introitus on admission Pain control with IV pain medication CNM to manage pt care with female provider preferred    Continue routine antenatal care.

## 2021-02-16 NOTE — Progress Notes (Signed)
Erika Humphrey is a 25 y.o. G3P2002 at [redacted]w[redacted]d by 15 week ultrasound admitted for advanced dilation and hour-glassing membranes.  Subjective: Pt comfortable, resting with IV pain medications and medication for sleep given.    Objective: BP 94/77   Pulse 82   Temp (!) 97.4 F (36.3 C) (Oral)   Resp 17   Ht 5\' 6"  (1.676 m)   Wt 50.3 kg   LMP 10/12/2020   SpO2 100%   BMI 17.92 kg/m  I/O last 3 completed shifts: In: 475.4 [I.V.:475.4] Out: -  No intake/output data recorded.  FHT:  153 UC:   Pt reports intermittent pain  that resolves with pain medication. SVE:   deferred  Labs: Lab Results  Component Value Date   WBC 11.4 (H) 02/16/2021   HGB 10.3 (L) 02/16/2021   HCT 31.1 (L) 02/16/2021   MCV 85.7 02/16/2021   PLT 303 02/16/2021    Assessment / Plan: Advanced dilation at [redacted]w[redacted]d  Labor:  expectant management/pain management to continue Preeclampsia:   n/a Fetal Wellbeing:   FHT wnl Pain Control:  IV pain meds I/D:  n/a Anticipated MOD:   vaginal delivery  [redacted]w[redacted]d 02/16/2021, 11:54 PM

## 2021-02-16 NOTE — Progress Notes (Signed)
Chaplain provided emotional support to pt, who acknowledged being in pain and seemed frustrated with and somewhat detached from the present situation.  Through interpreter, pt shared that she is very worried about her sick baby at home and her husband, who is caring for the baby.  She does not have anyone to be with her.  Chaplain explained role of spiritual caregiver and offered various types of support.  Pt requested that the chaplain ask RN for medicine to make her have contractions.  RN explained the reason for trying to avoid the baby coming so soon. Pt expressed confusion because she understood that the baby will come soon.  Via interpreter, and with RN support, chaplain explained the balance of trying to keep baby from delivering so soon, but also being ready if delivery becomes imminent.  Pt acknowledged understanding via interpreter.    Chaplain offered other types of support as desired, such a Koran (pt is Muslim; she already has one) or music from [place] (pt declined).  Pt brightened briefly when chaplain spoke of having former students from [place]. With RN permission, chaplain provided juice per pt request.  Chaplain left card, explained availability of services.    Please contact if further support is requested or warranted. (Pt would respond best to female chaplains.)  Theodoro Parma Pager:  [phone]    02/16/21 1423  Clinical Encounter Type  Visited With Patient  Visit Type Initial;Psychological support  Referral From Nurse  Consult/Referral To Chaplain  Stress Factors  Patient Stress Factors Exhausted;Health changes;Loss of control;Family relationships

## 2021-02-16 NOTE — Progress Notes (Signed)
RN attempted to utilize interpreter services and personal interpreter, but was unable to effectively communicate with pt. Patient presents with small to moderated vaginal bleeding and few small clots. Pt writhing in bed with a pain scale of 7/10 faces scale. RN called CNM to update on pt status and review plan of care. Joellyn Haff, CNM called at [phone] and pt will be transferred to Labor and Delivery.

## 2021-02-16 NOTE — Progress Notes (Signed)
Pt stated she wants pain medication and would also want something to help her sleep via interpreter. Pt medicated with fentyl and Ambien and resting at the moment. Sharen Counter, cnm aware.

## 2021-02-16 NOTE — Progress Notes (Signed)
Pashto interpreter Edward Qualia contacted via phone to admit pt to [facility]. Pt answered minimal questions and emphasized the want to rest. VS are stable, no pain verbalized, and scant bleeding was present. Pt is resting at this time.

## 2021-02-16 NOTE — Progress Notes (Signed)
Erika Humphrey is a 25 y.o. G3P2002 at [redacted]w[redacted]d.  Subjective: Mild cramping after Fentanyl. Asked Rn if we wouls do anything to help her deliver.   Objective: BP 94/77   Pulse 82   Temp (!) 97.4 F (36.3 C) (Oral)   Resp 17   Ht 5\' 6"  (1.676 m)   Wt 50.3 kg   LMP 10/12/2020   SpO2 100%   BMI 17.92 kg/m    FHT:  FHR: 156 bpm UC:   Irreg, mod Dilation:  (no cervix palpable) Presentation: Double Footling Breech Exam by:: [email or web address], CNM  Labs: NA  Assessment / Plan: [redacted]w[redacted]d week IUP Labor: No progression over the past 12 hours. No evidence of chorio. No indication to intervene per consult w/ Dr. [redacted]w[redacted]d. If pt is unchanged in am will transfer back to [facility].  Fetal Wellbeing:  + FHT Pain Control:  Fentanyl Anticipated MOD:  SVD  [facility] Katrinka Blazing, CNM 02/16/2021 6:01 PM

## 2021-02-17 ENCOUNTER — Inpatient Hospital Stay (HOSPITAL_COMMUNITY): Payer: Medicaid Other | Admitting: Anesthesiology

## 2021-02-17 ENCOUNTER — Encounter (HOSPITAL_COMMUNITY): Payer: Self-pay | Admitting: Obstetrics and Gynecology

## 2021-02-17 LAB — RPR: RPR Ser Ql: NONREACTIVE

## 2021-02-17 MED ORDER — PHENYLEPHRINE 40 MCG/ML (10ML) SYRINGE FOR IV PUSH (FOR BLOOD PRESSURE SUPPORT): 80.0000 ug | PREFILLED_SYRINGE | INTRAVENOUS | Status: DC | PRN

## 2021-02-17 MED ORDER — DIBUCAINE (PERIANAL) 1 % EX OINT: 1.0000 "application " | TOPICAL_OINTMENT | CUTANEOUS | Status: DC | PRN

## 2021-02-17 MED ORDER — EPHEDRINE 5 MG/ML INJ: 10.0000 mg | INTRAVENOUS | Status: DC | PRN

## 2021-02-17 MED ORDER — WITCH HAZEL-GLYCERIN EX PADS: 1.0000 "application " | MEDICATED_PAD | CUTANEOUS | Status: DC | PRN

## 2021-02-17 MED ORDER — SIMETHICONE 80 MG PO CHEW: 80.0000 mg | CHEWABLE_TABLET | ORAL | Status: DC | PRN

## 2021-02-17 MED ORDER — FENTANYL-BUPIVACAINE-NACL 0.5-0.125-0.9 MG/250ML-% EP SOLN
EPIDURAL | Status: AC
  Filled 2021-02-17: qty 250

## 2021-02-17 MED ORDER — IBUPROFEN 600 MG PO TABS: 600.0000 mg | ORAL_TABLET | Freq: Four times a day (QID) | ORAL | 0 refills | Status: DC

## 2021-02-17 MED ORDER — TETANUS-DIPHTH-ACELL PERTUSSIS 5-2.5-18.5 LF-MCG/0.5 IM SUSY: 0.5000 mL | PREFILLED_SYRINGE | Freq: Once | INTRAMUSCULAR | Status: DC

## 2021-02-17 MED ORDER — LIDOCAINE HCL (PF) 1 % IJ SOLN
INTRAMUSCULAR | Status: DC | PRN
  Administered 2021-02-17: 8 mL via EPIDURAL

## 2021-02-17 MED ORDER — DIPHENHYDRAMINE HCL 25 MG PO CAPS: 25.0000 mg | ORAL_CAPSULE | Freq: Four times a day (QID) | ORAL | Status: DC | PRN

## 2021-02-17 MED ORDER — ONDANSETRON HCL 4 MG PO TABS: 4.0000 mg | ORAL_TABLET | ORAL | Status: DC | PRN

## 2021-02-17 MED ORDER — PRENATAL MULTIVITAMIN CH: 1.0000 | ORAL_TABLET | Freq: Every day | ORAL | Status: DC

## 2021-02-17 MED ORDER — ACETAMINOPHEN 325 MG PO TABS: 650.0000 mg | ORAL_TABLET | ORAL | 0 refills | Status: DC | PRN

## 2021-02-17 MED ORDER — COCONUT OIL OIL: 1.0000 "application " | TOPICAL_OIL | Status: DC | PRN

## 2021-02-17 MED ORDER — ACETAMINOPHEN 325 MG PO TABS
650.0000 mg | ORAL_TABLET | ORAL | Status: DC | PRN
  Administered 2021-02-17: 650 mg via ORAL
  Filled 2021-02-17: qty 2

## 2021-02-17 MED ORDER — BENZOCAINE-MENTHOL 20-0.5 % EX AERO: 1.0000 "application " | INHALATION_SPRAY | CUTANEOUS | Status: DC | PRN

## 2021-02-17 MED ORDER — ZOLPIDEM TARTRATE 5 MG PO TABS: 5.0000 mg | ORAL_TABLET | Freq: Every evening | ORAL | Status: DC | PRN

## 2021-02-17 MED ORDER — ONDANSETRON HCL 4 MG/2ML IJ SOLN: 4.0000 mg | INTRAMUSCULAR | Status: DC | PRN

## 2021-02-17 MED ORDER — LACTATED RINGERS IV SOLN
500.0000 mL | Freq: Once | INTRAVENOUS | Status: DC
  Administered 2021-02-17: 500 mL via INTRAVENOUS

## 2021-02-17 MED ORDER — IBUPROFEN 600 MG PO TABS: 600.0000 mg | ORAL_TABLET | Freq: Four times a day (QID) | ORAL | Status: DC

## 2021-02-17 MED ORDER — BUTORPHANOL TARTRATE 1 MG/ML IJ SOLN
INTRAMUSCULAR | Status: AC
  Administered 2021-02-17: 2 mg
  Filled 2021-02-17: qty 2

## 2021-02-17 MED ORDER — DIPHENHYDRAMINE HCL 50 MG/ML IJ SOLN: 12.5000 mg | INTRAMUSCULAR | Status: DC | PRN

## 2021-02-17 MED ORDER — IBUPROFEN 600 MG PO TABS
600.0000 mg | ORAL_TABLET | Freq: Once | ORAL | Status: AC
  Administered 2021-02-17: 600 mg via ORAL
  Filled 2021-02-17: qty 1

## 2021-02-17 MED ORDER — SENNOSIDES-DOCUSATE SODIUM 8.6-50 MG PO TABS: 2.0000 | ORAL_TABLET | Freq: Every day | ORAL | Status: DC

## 2021-02-17 MED ORDER — BUTORPHANOL TARTRATE 1 MG/ML IJ SOLN: 2.0000 mg | Freq: Once | INTRAMUSCULAR | Status: AC

## 2021-02-17 MED ORDER — FENTANYL-BUPIVACAINE-NACL 0.5-0.125-0.9 MG/250ML-% EP SOLN
12.0000 mL/h | EPIDURAL | Status: DC | PRN
  Administered 2021-02-17: 12 mL/h via EPIDURAL

## 2021-02-17 NOTE — Anesthesia Postprocedure Evaluation (Signed)
Anesthesia Post Note  Patient: Erika Humphrey  Procedure(s) Performed: AN AD HOC LABOR EPIDURAL     Patient location during evaluation: Mother Baby Anesthesia Type: Epidural Level of consciousness: awake Pain management: satisfactory to patient Vital Signs Assessment: post-procedure vital signs reviewed and stable Respiratory status: spontaneous breathing Cardiovascular status: stable Anesthetic complications: no   No notable events documented.  Last Vitals:  Vitals:   02/17/21 0901 02/17/21 0931  BP: (!) 101/48 111/64  Pulse: 66 77  Resp:    Temp:    SpO2:      Last Pain:  Vitals:   02/17/21 0816  TempSrc:   PainSc: Asleep   Pain Goal: Patients Stated Pain Goal: 0 (02/15/21 2250)                 Cephus Shelling

## 2021-02-17 NOTE — Anesthesia Preprocedure Evaluation (Signed)
Anesthesia Evaluation  Patient identified by MRN, date of birth, ID band Patient awake    Reviewed: Allergy & Precautions, Patient's Chart, lab work & pertinent test results  Airway Mallampati: II  TM Distance: >3 FB Neck ROM: Full    Dental no notable dental hx.    Pulmonary neg pulmonary ROS,    Pulmonary exam normal breath sounds clear to auscultation       Cardiovascular negative cardio ROS Normal cardiovascular exam Rhythm:Regular Rate:Normal     Neuro/Psych negative neurological ROS  negative psych ROS   GI/Hepatic negative GI ROS, Neg liver ROS,   Endo/Other  negative endocrine ROS  Renal/GU negative Renal ROS  negative genitourinary   Musculoskeletal negative musculoskeletal ROS (+)   Abdominal   Peds negative pediatric ROS (+)  Hematology negative hematology ROS (+) anemia , hct 36.2, plt 318   Anesthesia Other Findings   Reproductive/Obstetrics (+) Pregnancy TOLAC, refugee from [place]. Does not know why she had a c section in [place]                              Anesthesia Physical  Anesthesia Plan  ASA: 2  Anesthesia Plan: Epidural   Post-op Pain Management:    Induction:   PONV Risk Score and Plan: 2  Airway Management Planned: Natural Airway  Additional Equipment: None  Intra-op Plan:   Post-operative Plan:   Informed Consent: I have reviewed the patients History and Physical, chart, labs and discussed the procedure including the risks, benefits and alternatives for the proposed anesthesia with the patient or authorized representative who has indicated his/her understanding and acceptance.     Interpreter used for [employer] Discussed with: Anesthesiologist  Anesthesia Plan Comments:         Anesthesia Quick Evaluation

## 2021-02-17 NOTE — Consult Note (Signed)
Neonatology Note:  Neonatology team at delivery due to uncertain dating with an estimated gestational age of [redacted]w[redacted]d by 14 week [place], unknown LMP.  Mother presented with vaginal bleeding, hour-glassing membranes and advanced dilation.  Attempt to obtain FHT prior to delivery however was not successful. Vaginal delivery of an intact placenta.  Fetus examined after sac rupture and exam consistent with a nonviable extremely preterm infant of [redacted] week gestation.  No heart rate or activity.  _____________________ John Giovanni, DO  Attending Neonatologist

## 2021-02-17 NOTE — Anesthesia Procedure Notes (Signed)
Epidural Patient location during procedure: OB Start time: 02/17/2021 5:00 AM End time: 02/17/2021 5:10 AM  Staffing Anesthesiologist: Mellody Dance, MD Performed: anesthesiologist   Preanesthetic Checklist Completed: patient identified, IV checked, site marked, risks and benefits discussed, monitors and equipment checked, pre-op evaluation and timeout performed  Epidural Patient position: sitting Prep: DuraPrep Patient monitoring: heart rate, cardiac monitor, continuous pulse ox and blood pressure Approach: midline Location: L2-L3 Injection technique: LOR saline  Needle:  Needle type: Tuohy  Needle gauge: 17 G Needle length: 9 cm Needle insertion depth: 5 cm Catheter type: closed end flexible Catheter size: 20 Guage Catheter at skin depth: 10 cm Test dose: negative and Other  Assessment Events: blood not aspirated, injection not painful, no injection resistance and negative IV test  Additional Notes Informed consent obtained prior to proceeding including risk of failure, 1% risk of PDPH, risk of minor discomfort and bruising.  Discussed rare but serious complications including epidural abscess, permanent nerve injury, epidural hematoma.  Discussed alternatives to epidural analgesia and patient desires to proceed.  Timeout performed pre-procedure verifying patient name, procedure, and platelet count.  Patient tolerated procedure well.

## 2021-02-17 NOTE — Discharge Instructions (Signed)
? ???????? ? ????? ?????? ???? ? ??? ??? ????? ???? ????? ??????? ????? ? ????? ?? ????? ? ????? ?? ???? ?????? ?????? ? ??? ????? ???????? ?? ??? ?????? ?????? ???. ????? ? ?????? ??????? ???? ?????? ???? ???? ?? ??? ?????? ???????? ?? ?????. ?? ???? ?????? ?? ?????? ???? ? ??? ?????? ??????? ???? ????? ??? ????? ?????. ?? ? ?????? ?????? ?? ??? ???? ??? ????? ? ????? ?? ???? ?????? ??? ???? ???? ?? ????? ?? ?? ????? ??? ?? ???? ???? ??????? ???? ?? ?? ???? ??? ????? ?????. ??? ??????? ????? ?? ??:  ?????.  ????.  ?????.  ????.  ?????. ? ????? ?? ????? ??????? ?? ????? ?? ?? ????:  ????? ?? ????? ????? ?????? ?? ????? ? ????? ?? ????? ? ???? (perineum) ? ???????? ?? ??? ? ??? ???.  ????? ( ?????).  ??????.  ? ???? ??????? ? ??????? ??? ???? ???.  ????? ? ????? ??? ?? ?? ???? ?? ???????. ?? ???? ? 6 ????? ????? ???? ????. ???? ????? ?? ???? ?? ??? ??? ??? ??? ?????? ??? ??? ?? ?? ??? ?? ???? ??.  ????? ?? ????? ?? ?????? ?? ??????? ?? ???? ? episiotomy ?? ? ???????? ????? ??????. ?? ????? ?? ?? ???? ???? ????. ?? ??? ?? ?? ???????? ????? ???: ???????? ??????? ?? ?????   1. ? ??? ????? ????? ? ????? ?? ??? ?? ??. ? ????? ???? ?????? ?? ???? ?? ?? ??? ?? ????? ?? ????? ????? ???? ?????? ?????? ????? ?????? ?? ? ?? ? ????? ???? ???? ??. o ? ??? ?? ???? ?? ?????? ????? ?? ????? ?? ?? ???? ????? ?? ? ????? ?? ????? ??? ???? ???. ? ??? ?? ???? ????? ??? ????? ???? ?? ???? ????? ???? ?? ???? ????? ?? ????. o ????? ? ?????? ??????? ???? ?????? ? ?????? ?????? ????????? ?? ? ?? ????? ???? ?? ???? ??? ? ??? ??? ?? ?????? ?? ????? ????. ?????? ???? ?? ??? ????? ?????? ? ?????? ????? ? ???? ?? ?? ??????? ? ??? ???? ?? ???? ??? ??? ???. 2. ?????? ???? ?? ??? ? ????? ?? ????? ????? ?? ??? ?? ?? ????? ? ????? ??? ? ?? ??? ??? ?? ??? ?????? ????. ?? ??? ??? ?? ??????? ?? ???? ? ?? ?? ??? ???? ???? ?? ???? ?? ????. 3. ? ??? ????? ? ?????? ????? ????? ?? ????? ? ????? ????? ?????? ???? ???? ???? ?? ?? ?????  ????? ???? ??. 4. ? ??? ??? ????? ???? ???? ? ?????? ?????? ????? ?????. ????? ??? ?? ????? ??????? ? ????????? ?????? ? ??? ????? ????? ??? ?? ????? ???. 5. ? ???????? ????? ???? ???? ?????. ??? ?? ????? ?? ???? ??: o ??? ????? ?? ??????? ?? ?????? ?? ????. o ? ??? ????????? ????? ??? ????? ?? ???? ?? ???? ???? ?? ???? ???? ???. o ?? ?? ??? ??? ?? ???? ?? ???? ?????? ?? ???? ??? ????? ???. ???? ???? ? ???? ?????? ??? ????? ??? ???? ???? ?? ??? ?????? ??? ???. ? ???????? ?? ??????? ??????? 1. ??? perineum ??? ?? ?? ?????. 2. ??? ?? ???? ????? ????? ? ?? ??? ?? ?? ??? ???. 3. ?? ???? ? ???? ????? ?? ???? ????? ???? ? ??????? ??? ????? ???? ?????? ???: o ???????? ????? ?? ??? ??????. o ??? ?? ?? ??? ?????? ???? ????. 4. ? ??? ?? ???? ?? ??? ?? ? ?????? ??? ??? ? ?????? ?????? ??? ???? ?? ??? ?? 2-3 ??? ??? ???? ???? ????.  5. ?? ??? ???? ???? ? ????? ?? ??? ??? ??? ?? ???? ?? ?? ?? ????? ? ?????? ??????? ???? ?????? ???? ?? ??? ??. ???? ?? ???  ?? ???? ?????? ?????? ???? ???? ????? ????? ??? ???? ??. ??? ????? ????? ?? ?? ????? ?????? ????? ??? ???? ? ????? ??? ????? ?? ?? ???? ????. ? ??? ????? ????? ?? ?????? ??? ??????? ?? ???? ???? ?? ??????? ???? ??. ??????  ?? ????? ??? ?? ?????? ??? ? ???? ?????? ????? ? ????? ?? ?????? ?? ?? ?? ??? ????? ????.  ??? ???? ????????? ?? ????? ????? ?? ??? ???? ?? ????? ? ?????? ??????? ???? ????? ???? ??? ???. ? ??? ?????? ??????? ???? ????? ??? ?????? ???? ??? ????????? ????? ????? ????? ??.  ? ????? ?? ??? ??????? ????.  ? ??? ?????? ??????? ???? ????? ??? ?????? ???? ?? ??? ???? ???? ????? ??? ?? ???? ???? ???. ????? ????????    ? ???? ?? ???? ???? ????? ??? ??? ????? ??? ???? ?? ????? ? ?????? ??????? ???? ????? ???? ??? ???.  ? ????? ?? ?? ????? ???? ???????.  ?? ???? ? ???? ???? ???? ???? ??? ????? ?? ??? ?????? ??????? ?? ??? ???? ???? ??? ????. ???? ??? ?? ???? ? ??? ?????? ??????? ???? ????? ???????? ?????? ??? ?? ????? ?? ? ???? ???? ?? ?????.   ??? ???? ?? ? ??? ???? ??? ?? ???? ?????. ????? ?? ?? ?? ??? ???? ??? ?? ???? ?? ??? ??.  ? ?????? ?? ????? ??? ??? ????. ????? ???? ?? ???? ?? ????? ??? ?? ????? ????? ??? ??? ??????? ???.  ??? ???? ????? ?? ??????? ?? ???? ? ????? ???? ??????? ??? ?? ?? 2 ????? ??? ??? ???? ??? ?? ????? ?? ???? ?? ? ??????? ??? ????. ???? ???? ? ????? ?????? ?? ????? ????.  ??? ?????? ????? ?????. ?? ???? ??. ?? ?? ????? ??? ????? ? ?????? ??????? ???? ?????? ?? ????? ?? ??? ?????? ??? ?? ???? ?? ????? ?? ???????? ???? ?????? ???. ????? ?? ??? ??????? ?????? ??? ? ???? ????: [email or web address] ??????? ?????: [email or web address] ? ????????? ?? ? ????? ?? ???? ?????? ????? ???? ???: [email or web address] ? ?????? ??????? ???? ????? ??? ????? ????? ??: ???? ? ???????? ???????? ??? ?????? ???:  ???? ? ????? ?? ????? ????? ???.  ???? ? ????? ?? ??? ???? ?????? ???.  ???? ?? ??? ????????? ?? ???????? ?? ???? ????? ?? ???? ?? ???? ?????. ???? ????? ?????? ???:  ???? ??? ???.  ???? ?? ??? ??????? ???? ?? ??? ???? ?????? ?? ??? ???.  ????? ? ??? ?? ????? ??? ? ???? ?? ?? ??? ?????? ???? ???? ??? ?? ????? ? ????? ??? ? ???? ???? ??????.  ??? ?? ???? ????? ??? ??? ?? ??????? ????? ???.  ????? ???? ???? ???? ?? ?????? ????.  ???? ???????? ???? ?? ??? ??? ????.  ???? ???

## 2021-02-17 NOTE — Discharge Summary (Signed)
Postpartum Discharge Summary  Date of Service updated 02/17/21     Patient Name: Erika Humphrey DOB: 1996/02/28 MRN: [id number]  Date of admission: 02/15/2021 Delivery date:02/17/2021  Delivering provider: Fatima Blank A  Date of discharge: 02/17/2021  Admitting diagnosis: Incompetent cervix during second trimester, antepartum [O34.32] Pregnant [Z34.90] Intrauterine pregnancy: [redacted]w[redacted]d     Secondary diagnosis:  Active Problems:   Premature cervical dilation, second trimester   Pregnant  Additional problems: None    Discharge diagnosis: Preterm Pregnancy Delivered                                              Post partum procedures: none Augmentation: N/A Complications: [facility] course: Onset of Labor With Vaginal Delivery      25 y.o. yo [id number] at [redacted]w[redacted]d was admitted in Latent Labor on 02/15/2021. Patient had an uncomplicated labor course as follows:  Membrane Rupture Time/Date: 5:29 AM ,02/17/2021   Delivery Method:Vaginal, Spontaneous  Episiotomy: None  Lacerations:  None  Patient had an uncomplicated postpartum course.  She is ambulating, tolerating a regular diet, passing flatus, and urinating well. Patient is discharged home in stable condition on 02/17/21.  Newborn Data: Birth date:02/17/2021  Birth time:5:29 AM  Gender:Female  Living status:Fetal Demise  Apgars:0 ,0  Weight:346 g   Magnesium Sulfate received: No BMZ received: Yes Rhophylac:No MMR:No T-DaP: unknown Flu: Yes Transfusion:No  Physical exam  Vitals:   02/17/21 0801 02/17/21 0831 02/17/21 0901 02/17/21 0931  BP: (!) 102/54 (!) 100/52 (!) 101/48 111/64  Pulse: 66 68 66 77  Resp:      Temp:      TempSrc:      SpO2:      Weight:      Height:       General: alert, cooperative, and no distress Lochia: appropriate Uterine Fundus: firm Incision: N/A DVT Evaluation: No evidence of DVT seen on physical exam. Labs: Lab Results  Component Value Date   WBC 11.4 (H) 02/16/2021   HGB  10.3 (L) 02/16/2021   HCT 31.1 (L) 02/16/2021   MCV 85.7 02/16/2021   PLT 303 02/16/2021   CMP Latest Ref Rng & Units 09/19/2020  Glucose 65 - 99 mg/dL 87  BUN 6 - 20 mg/dL 9  Creatinine 0.57 - 1.00 mg/dL 0.53(L)  Sodium 134 - 144 mmol/L 138  Potassium 3.5 - 5.2 mmol/L 4.5  Chloride 96 - 106 mmol/L 102  CO2 20 - 29 mmol/L 20  Calcium 8.7 - 10.2 mg/dL 9.4  Total Protein 6.0 - 8.5 g/dL 7.3  Total Bilirubin 0.0 - 1.2 mg/dL <0.2  Alkaline Phos 44 - 121 IU/L 74  AST 0 - 40 IU/L 20  ALT 0 - 32 IU/L 26   Edinburgh Score: Edinburgh Postnatal Depression Scale Screening Tool 08/08/2020  I have been able to laugh and see the funny side of things. 0  I have looked forward with enjoyment to things. 0  I have blamed myself unnecessarily when things went wrong. 0  I have been anxious or worried for no good reason. 0  I have felt scared or panicky for no good reason. 0  Things have been getting on top of me. 0  I have been so unhappy that I have had difficulty sleeping. 0  I have felt sad or miserable. 0  I have been so unhappy that  I have been crying. 0  The thought of harming myself has occurred to me. 0  Edinburgh Postnatal Depression Scale Total 0     After visit meds:  Allergies as of 02/17/2021   No Known Allergies      Medication List     TAKE these medications    acetaminophen 325 MG tablet Commonly known as: Tylenol Take 2 tablets (650 mg total) by mouth every 4 (four) hours as needed (for pain scale < 4).   Blood Pressure Monitoring Devi 1 each by Does not apply route once a week.   docusate sodium 100 MG capsule Commonly known as: COLACE Take 1 capsule (100 mg total) by mouth 2 (two) times daily.   ferrous sulfate 325 (65 FE) MG tablet Commonly known as: FerrouSul Take 1 tablet (325 mg total) by mouth every other day.   ibuprofen 600 MG tablet Commonly known as: ADVIL Take 1 tablet (600 mg total) by mouth every 6 (six) hours.   multivitamin-prenatal 27-0.8 MG  Tabs tablet Take 1 tablet by mouth daily at 12 noon.         Discharge home in stable condition Infant Feeding: n/a Infant Disposition:home with mother Discharge instruction: per After Visit Summary and Postpartum booklet. Activity: Advance as tolerated. Pelvic rest for 6 weeks.  Diet: routine diet  1. Short interval between pregnancies affecting pregnancy in first trimester, antepartum   2. Anemia affecting pregnancy in second trimester   3. Premature cervical dilation, second trimester   4. Language barrier affecting health care   5. [redacted] weeks gestation of pregnancy   6. Vaginal bleeding in pregnancy, second trimester   7. Preterm delivery, delivered     Future Appointments: Future Appointments  Date Time Provider [place]  02/24/2021  9:30 AM [facility] NURSE [facility]  02/24/2021  9:45 AM [facility] US4 [facility]  02/28/2021 10:35 AM Karim-Rhoades, Dara Lords, CNM [facility]   Follow up Visit:  [place] for [place] at [facility] Follow up.   Specialty: Obstetrics and Gynecology Why: In 4-6 weeks for postpartum visit Contact information: [place] [id number] [phone]                Message sent to [facility] on 02/17/21:  Please schedule this patient for a In person postpartum visit in 4 weeks with the following provider: Any provider and female only . Additional Postpartum F/U:Postpartum Depression checkup  High risk pregnancy complicated by:  advanced dilation and preterm delivery at 21 weeks Delivery mode:  Vaginal, Spontaneous  Anticipated Birth Control:   pills vs Depo   02/17/2021 Fatima Blank, CNM

## 2021-02-19 LAB — SURGICAL PATHOLOGY

## 2021-02-24 ENCOUNTER — Ambulatory Visit: Payer: Medicaid Other

## 2021-02-28 ENCOUNTER — Encounter: Payer: Medicaid Other | Admitting: Family

## 2021-03-03 ENCOUNTER — Telehealth (HOSPITAL_COMMUNITY): Payer: Self-pay | Admitting: *Deleted

## 2021-03-03 NOTE — Telephone Encounter (Signed)
Voicemail box not set up, so no message left.  Duffy Rhody, RN 03-03-2021 at 3:00pm

## 2021-03-06 ENCOUNTER — Encounter: Payer: Self-pay | Admitting: Advanced Practice Midwife

## 2021-03-06 DIAGNOSIS — Z8759 Personal history of other complications of pregnancy, childbirth and the puerperium: Secondary | ICD-10-CM | POA: Insufficient documentation

## 2021-03-20 ENCOUNTER — Ambulatory Visit (INDEPENDENT_AMBULATORY_CARE_PROVIDER_SITE_OTHER): Payer: Medicaid Other | Admitting: Student

## 2021-03-20 ENCOUNTER — Encounter: Payer: Self-pay | Admitting: Student

## 2021-03-20 ENCOUNTER — Other Ambulatory Visit: Payer: Self-pay

## 2021-03-20 VITALS — BP 103/73 | HR 83 | Wt 124.5 lb

## 2021-03-20 DIAGNOSIS — Z3042 Encounter for surveillance of injectable contraceptive: Secondary | ICD-10-CM | POA: Diagnosis not present

## 2021-03-20 DIAGNOSIS — Z3202 Encounter for pregnancy test, result negative: Secondary | ICD-10-CM | POA: Diagnosis not present

## 2021-03-20 DIAGNOSIS — Z5189 Encounter for other specified aftercare: Secondary | ICD-10-CM | POA: Diagnosis not present

## 2021-03-20 DIAGNOSIS — O039 Complete or unspecified spontaneous abortion without complication: Secondary | ICD-10-CM

## 2021-03-20 LAB — POCT PREGNANCY, URINE: Preg Test, Ur: NEGATIVE

## 2021-03-20 MED ORDER — MEDROXYPROGESTERONE ACETATE 150 MG/ML IM SUSP
150.0000 mg | Freq: Once | INTRAMUSCULAR | Status: AC
  Administered 2021-03-20: 150 mg via INTRAMUSCULAR

## 2021-03-20 NOTE — Progress Notes (Signed)
Patient ID: Erika Humphrey, female   DOB: 1996-05-17, 25 y.o.   MRN: [id number]  History:  Ms. Erika Humphrey is a 25 y.o. [id number] who presents to clinic today for follow up for 27 week fetal loss. She says that she is doing very well. SHe says that her bleeding has stopped. She says that she has not had intercourse since the death of her baby. She does not want to get pregnant for at least two years but she does not want her husband to know that she is using contraception.   The following portions of the patient's history were reviewed and updated as appropriate: allergies, current medications, family history, past medical history, social history, past surgical history and problem list.  Review of Systems:  Review of Systems  Constitutional: Negative.   HENT: Negative.    Respiratory: Negative.    Cardiovascular: Negative.   Genitourinary: Negative.   Musculoskeletal: Negative.   Skin: Negative.   Neurological: Negative.   Psychiatric/Behavioral: Negative.       Objective:  Physical Exam BP 103/73   Pulse 83   Wt 124 lb 8 oz (56.5 kg)   LMP 10/12/2020   BMI 20.09 kg/m  Physical Exam HENT:     Head: Normocephalic.     Right Ear: Tympanic membrane normal.     Mouth/Throat:     Mouth: Mucous membranes are moist.  Abdominal:     General: Abdomen is flat.  Musculoskeletal:     Cervical back: Normal range of motion.  Skin:    General: Skin is warm.  Neurological:     General: No focal deficit present.     Mental Status: She is alert.  Psychiatric:        Mood and Affect: Mood normal.      Labs and Imaging No results found for this or any previous visit (from the past 24 hour(s)).  No results found.  Health Maintenance Due  Topic Date Due   COVID-19 Vaccine (2 - Booster for Janssen series) 05/05/2020   INFLUENZA VACCINE  01/13/2021     Assessment & Plan:   1. Follow-up visit after miscarriage   -patient doing well -patient had negative pregnancy test today and  wants depo shot today; preg test was negative -explained importance of keeping up with Depo shots otherwise she is at risk for unintended pregnancy.  -patient agrees, after long discussion with interpreter, to pap smear in three months with female provider. I discussed, with interpreter, the process of a pap smear and explained that we will be able to do the exam as respectfully and gently as possible. I explained it is for screening for cervical cancer, which is an important part of her health and well-being.   Approximately 30 minutes of total time was spent with this patient on counseling and coordination of care.   Marylene Land, CNM 03/20/2021 11:32 AM

## 2021-03-20 NOTE — Progress Notes (Signed)
Patient would like to start birth control. Depo Provera injectable administered into left upper outer quadrant without any complications. Patient will return between Dec 22, 22- Jan 5, 23. Patient verbalized understanding. All questions and concerns were addressed.   Dawayne Patricia, CMA  03/20/21

## 2021-06-05 ENCOUNTER — Ambulatory Visit: Payer: Medicaid Other

## 2021-06-12 ENCOUNTER — Ambulatory Visit (INDEPENDENT_AMBULATORY_CARE_PROVIDER_SITE_OTHER): Payer: Medicaid Other

## 2021-06-12 ENCOUNTER — Other Ambulatory Visit: Payer: Self-pay

## 2021-06-12 VITALS — BP 118/70 | HR 84 | Wt 131.6 lb

## 2021-06-12 DIAGNOSIS — Z3042 Encounter for surveillance of injectable contraceptive: Secondary | ICD-10-CM

## 2021-06-12 MED ORDER — MEDROXYPROGESTERONE ACETATE 150 MG/ML IM SUSP
150.0000 mg | Freq: Once | INTRAMUSCULAR | Status: AC
  Administered 2021-06-12: 12:00:00 150 mg via INTRAMUSCULAR

## 2021-06-12 NOTE — Progress Notes (Signed)
Beau Fanny here for Depo-Provera Injection. Injection administered without complication. Patient will return in 3 months for next injection between March 16 and March 30. Next annual with pap smear scheduled by front office.    Ralene Bathe, RN 06/12/2021  10:26 AM

## 2021-08-06 ENCOUNTER — Ambulatory Visit: Payer: Medicaid Other | Admitting: Certified Nurse Midwife

## 2021-09-02 ENCOUNTER — Ambulatory Visit: Payer: Medicaid Other

## 2021-09-10 ENCOUNTER — Ambulatory Visit: Payer: Medicaid Other

## 2021-09-27 ENCOUNTER — Emergency Department (HOSPITAL_COMMUNITY): Payer: Medicaid Other

## 2021-09-27 ENCOUNTER — Other Ambulatory Visit: Payer: Self-pay

## 2021-09-27 ENCOUNTER — Emergency Department (HOSPITAL_COMMUNITY)
Admission: EM | Admit: 2021-09-27 | Discharge: 2021-09-27 | Disposition: A | Discharge status: Home / Self Care | Payer: Medicaid Other | Attending: Emergency Medicine | Admitting: Emergency Medicine

## 2021-09-27 ENCOUNTER — Encounter (HOSPITAL_COMMUNITY): Payer: Self-pay | Admitting: Emergency Medicine

## 2021-09-27 DIAGNOSIS — Z20822 Contact with and (suspected) exposure to covid-19: Secondary | ICD-10-CM | POA: Insufficient documentation

## 2021-09-27 DIAGNOSIS — R519 Headache, unspecified: Secondary | ICD-10-CM | POA: Diagnosis present

## 2021-09-27 DIAGNOSIS — R110 Nausea: Secondary | ICD-10-CM | POA: Insufficient documentation

## 2021-09-27 LAB — CBC WITH DIFFERENTIAL/PLATELET
Abs Immature Granulocytes: 0.02 10*3/uL (ref 0.00–0.07)
Basophils Absolute: 0 10*3/uL (ref 0.0–0.1)
Basophils Relative: 1 %
Eosinophils Absolute: 0.3 10*3/uL (ref 0.0–0.5)
Eosinophils Relative: 4 %
HCT: 37.8 % (ref 36.0–46.0)
Hemoglobin: 11.7 g/dL — ABNORMAL LOW (ref 12.0–15.0)
Immature Granulocytes: 0 %
Lymphocytes Relative: 37 %
Lymphs Abs: 2.4 10*3/uL (ref 0.7–4.0)
MCH: 23.2 pg — ABNORMAL LOW (ref 26.0–34.0)
MCHC: 31 g/dL (ref 30.0–36.0)
MCV: 74.9 fL — ABNORMAL LOW (ref 80.0–100.0)
Monocytes Absolute: 0.6 10*3/uL (ref 0.1–1.0)
Monocytes Relative: 8 %
Neutro Abs: 3.3 10*3/uL (ref 1.7–7.7)
Neutrophils Relative %: 50 %
Platelets: 401 10*3/uL — ABNORMAL HIGH (ref 150–400)
RBC: 5.05 MIL/uL (ref 3.87–5.11)
RDW: 15.1 % (ref 11.5–15.5)
WBC: 6.5 10*3/uL (ref 4.0–10.5)
nRBC: 0 % (ref 0.0–0.2)

## 2021-09-27 LAB — BASIC METABOLIC PANEL
Anion gap: 7 (ref 5–15)
BUN: 13 mg/dL (ref 6–20)
CO2: 23 mmol/L (ref 22–32)
Calcium: 9.2 mg/dL (ref 8.9–10.3)
Chloride: 108 mmol/L (ref 98–111)
Creatinine, Ser: 0.79 mg/dL (ref 0.44–1.00)
GFR, Estimated: >60 mL/min (ref >60)
Glucose, Bld: 93 mg/dL (ref 70–99)
Potassium: 3.8 mmol/L (ref 3.5–5.1)
Sodium: 138 mmol/L (ref 135–145)

## 2021-09-27 LAB — RESP PANEL BY RT-PCR (FLU A&B, COVID) ARPGX2
Influenza A by PCR: NEGATIVE
Influenza B by PCR: NEGATIVE
SARS Coronavirus 2 by RT PCR: NEGATIVE

## 2021-09-27 LAB — HCG, QUANTITATIVE, PREGNANCY: hCG, Beta Chain, Quant, S: <1 m[IU]/mL (ref <5)

## 2021-09-27 LAB — GROUP A STREP BY PCR: Group A Strep by PCR: NOT DETECTED

## 2021-09-27 MED ORDER — ACETAMINOPHEN 325 MG PO TABS
650.0000 mg | ORAL_TABLET | Freq: Once | ORAL | Status: AC
  Administered 2021-09-27: 650 mg via ORAL
  Filled 2021-09-27: qty 2

## 2021-09-27 MED ORDER — ONDANSETRON 4 MG PO TBDP
4.0000 mg | ORAL_TABLET | Freq: Once | ORAL | Status: AC
  Administered 2021-09-27: 4 mg via ORAL
  Filled 2021-09-27: qty 1

## 2021-09-27 NOTE — ED Provider Notes (Signed)
?[facility] EM[facility]ENCY [facility] ?Provider Note ? ? ?CSN: [id number] ?Arrival date & time: 09/27/21  1639 ? ?  ? ?History ? ?Chief Complaint  ?Patient presents with  ? Headache  ? ? ?Erika FolksRohida Eustaquio MaizeKomando is a 26 y.o. female. ? ?HPI ?Patient is a 26 year old female who presents to the emergency department due to waxing and waning headache for the past 2 days.  History obtained via Pashto interpreter.  Patient states that her symptoms have been persistent.  She has not taken anything for her symptoms.  Reports associated nausea.  No cough, rhinorrhea, or sore throat, vomiting, chest pain, abdominal pain, numbness, weakness, visual changes.  Denies a history of similar symptoms. ?  ? ?Home Medications ?Prior to Admission medications   ?Not on File  ?   ? ?Allergies    ?Patient has no known allergies.   ? ?Review of Systems   ?Review of Systems  ?All other systems reviewed and are negative. ?Ten systems reviewed and are negative for acute change, except as noted in the HPI.   ?Physical Exam ?Updated Vital Signs ?BP 110/72 (BP Location: Right Arm)   Pulse 86   Temp 98.8 ?F (37.1 ?C) (Oral)   Resp 16   LMP 08/27/2021   SpO2 100%  ?Physical Exam ?Vitals and nursing note reviewed.  ?Constitutional:   ?   General: She is not in acute distress. ?   Appearance: Normal appearance. She is well-developed. She is not ill-appearing, toxic-appearing or diaphoretic.  ?HENT:  ?   Head: Normocephalic and atraumatic.  ?   Comments: No rash or signs of trauma noted to the face or scalp. ?   Right Ear: External ear normal.  ?   Left Ear: External ear normal.  ?   Nose: Nose normal.  ?   Mouth/Throat:  ?   Mouth: Mucous membranes are moist.  ?   Pharynx: Oropharynx is clear. No oropharyngeal exudate or posterior oropharyngeal erythema.  ?Eyes:  ?   General: No scleral icterus. ?   Extraocular Movements: Extraocular movements intact.  ?   Right eye: Normal extraocular motion and no nystagmus.  ?   Left eye: Normal  extraocular motion and no nystagmus.  ?   Pupils: Pupils are equal, round, and reactive to light. Pupils are equal.  ?   Right eye: Pupil is round and reactive.  ?   Left eye: Pupil is round and reactive.  ?Neck:  ?   Comments: No nuchal rigidity. ?Cardiovascular:  ?   Rate and Rhythm: Normal rate.  ?   Pulses: Normal pulses.  ?Pulmonary:  ?   Effort: Pulmonary effort is normal.  ?Abdominal:  ?   General: Abdomen is flat.  ?   Palpations: Abdomen is soft.  ?   Tenderness: There is no abdominal tenderness.  ?Musculoskeletal:     ?   General: Normal range of motion.  ?   Cervical back: Normal range of motion and neck supple. No tenderness.  ?Skin: ?   General: Skin is warm and dry.  ?Neurological:  ?   General: No focal deficit present.  ?   Mental Status: She is alert and oriented to person, place, and time.  ?   GCS: GCS eye subscore is 4. GCS verbal subscore is 5. GCS motor subscore is 6.  ?   Comments: A&O x3.  Speaking clearly, coherently, and in complete sentences.  Moving all 4 extremities with ease.  No gross deficits.  ?Psychiatric:     ?  Mood and Affect: Mood normal.     ?   Behavior: Behavior normal.  ? ? ?ED Results / Procedures / Treatments   ?Labs ?(all labs ordered are listed, but only abnormal results are displayed) ?Labs Reviewed  ?CBC WITH DIFFERENTIAL/PLATELET - Abnormal; Notable for the following components:  ?    Result Value  ? Hemoglobin 11.7 (*)   ? MCV 74.9 (*)   ? MCH 23.2 (*)   ? Platelets 401 (*)   ? All other components within normal limits  ?RESP PANEL BY RT-PCR (FLU A&B, COVID) ARPGX2  ?GROUP A STREP BY PCR  ?BASIC METABOLIC PANEL  ?HCG, QUANTITATIVE, PREGNANCY  ?I-STAT BETA HCG BLOOD, ED ([facility], [facility], [facility] ONLY)  ? ?EKG ?None ? ?Radiology ?CT HEAD WO CONTRAST ( ) ? ?Result Date: 09/27/2021 ?CLINICAL DATA:  Severe headache EXAM: CT HEAD WITHOUT CONTRAST TECHNIQUE: Contiguous axial images were obtained from the base of the skull through the vertex without intravenous contrast. RADIATION DOSE  REDUCTION: This exam was performed according to the departmental dose-optimization program which includes automated exposure control, adjustment of the mA and/or kV according to patient size and/or use of iterative reconstruction technique. COMPARISON:  08/29/2020 FINDINGS: Brain: No evidence of acute infarction, hemorrhage, hydrocephalus, extra-axial collection or mass lesion/mass effect. Vascular: No hyperdense vessel or unexpected calcification. Skull: Normal. Negative for fracture or focal lesion. Sinuses/Orbits: No acute finding. Other: None. IMPRESSION: No acute intracranial pathology. No noncontrast CT findings to explain headache. Electronically Signed   By: Jearld Lesch M.D.   On: 09/27/2021 17:43   ? ?Procedures ?Procedures  ? ?Medications Ordered in ED ?Medications  ?ondansetron (ZOFRAN-ODT) disintegrating tablet 4 mg (4 mg Oral Given 09/27/21 2059)  ?acetaminophen (TYLENOL) tablet 650 mg (650 mg Oral Given 09/27/21 2059)  ? ?ED Course/ Medical Decision Making/ A&P ?  ?                        ?Medical Decision Making ?Amount and/or Complexity of Data Reviewed ?Labs: ordered. ? ?Risk ?OTC drugs. ?Prescription drug management. ? ?Pt is a 26 y.o. female who presents to the emergency department due to a waxing waning headache for the past 2 days. ? ?Labs: ?CBC with a hemoglobin of 11.7, MCV of 74.9, MCH of 23.2, platelets of 401. ?BMP is within normal limits. ?Quantitative hCG less than 1. ?Group A strep by PCR is negative. ?Respiratory panel is negative. ? ?Imaging: ?CT scan of the head without contrast shows no acute intracranial pathology.  No noncontrast CT findings to explain headache ? ?I, Placido Sou, PA-C, personally reviewed and evaluated these images and lab results as part of my medical decision-making. ? ?Unsure the source of the patient's symptoms.  Denies a history of similar headaches.  Reports nausea along with her headache but otherwise denies any other complaints.  On my exam she is A&O  x3.  Speaking clearly and coherently.  Normal gait.  No gross deficits noted.  Work-up appears generally reassuring.  Hemoglobin of 11.7 appears stable.  No electrolyte derangements noted on BMP.  Respiratory panel and strep test are both negative.  CT scan of the head without contrast was obtained in triage which shows "no acute intracranial pathology." ? ?Offered patient a migraine cocktail but she declined any IV medications and would prefer to be treated orally.  She states that her husband works at night so she cannot take Benadryl.  We will give patient Tylenol as well as Zofran.  Recommended continued  use of Tylenol as well as Motrin at home.  She verbalized understanding.  She states she does not have a PCP so she was given a referral to [facility] and wellness.  We discussed return precautions.  Her questions were answered and she was amicable at the time of discharge. ? ?Note: Portions of this report may have been transcribed using voice recognition software. Every effort was made to ensure accuracy; however, inadvertent computerized transcription errors may be present.  ? ?Final Clinical Impression(s) / ED Diagnoses ?Final diagnoses:  ?Bad headache  ? ? ?Rx / DC Orders ?ED Discharge Orders   ? ? None  ? ?  ? ? ?  ?Placido Sou, PA-C ?09/27/21 2124 ? ?  ?Milagros Loll, MD ?09/28/21 1710 ? ?

## 2021-09-27 NOTE — ED Provider Triage Note (Signed)
Emergency Medicine Provider Triage Evaluation Note ? ?Erika Humphrey , a 26 y.o. female  was evaluated in triage.  Pt complains of headache and generally feeling unwell.  This has been ongoing for 2 days.  She denies cough however was heard coughing multiple times by my self.  She states she feels nauseated.  She reports hot flashes and cold chills.  Mild myalgias.  Describes her headache as a pressure.  ? ?Interactions with patient.  Performed through professional language speaking medical interpreter.  ? ?Physical Exam  ?BP 113/74 (BP Location: Right Arm)   Pulse 84   Temp 98.8 ?F (37.1 ?C) (Oral)   Resp 16   LMP 08/27/2021   SpO2 98%  ?Gen:   Awake, no distress   ?Resp:  Normal effort  ?MSK:   Moves extremities without difficulty  ?Other:  Normal gait and speech.  ? ?Medical Decision Making  ?Medically screening exam initiated at 4:56 PM.  Appropriate orders placed.  Erika Humphrey was informed that the remainder of the evaluation will be completed by another provider, this initial triage assessment does not replace that evaluation, and the importance of remaining in the ED until their evaluation is complete. ? ? ?  ?Cristina Gong, PA-C ?09/27/21 1659 ? ?

## 2021-09-27 NOTE — Discharge Instructions (Signed)
Like we discussed, I would recommend taking Tylenol as well as ibuprofen for management of your headaches.  Please follow the instructions on the bottles.  Please continue to monitor your symptoms closely and return to the emergency department if you develop any new or worsening symptoms. ? ?Below is the contact information for [employer] and wellness.  They can act as your primary care provider.  Please give them a call and schedule an appointment for reevaluation. ? ? ?######################## ? ? ???? ???? ?? ??? ??? ??? ? ?? ??????? ??? ?? ????? ? ?? ??? ?????? ????? ??????? ?? ibuprofen ?????.  ??????? ???? ? ??????? ?? ??? ???????? ????? ???.  ??????? ???? ???? ??? ?? ???? ????? ???? ?? ????? ????? ?? ????? ?????? ?? ?? ???? ??? ??? ?? ?????????? ??? ???????? ???. ? ?????? ? ????? ?????? ?? ??????? ????? ? ????? ??????? ??.  ??? ???? ?? ????? ? ?????? ??????? ???? ????? ?? ???? ??? ????.  ??????? ???? ??? ?? ??? ???? ?? ? ??? ?????? ????? ??? ?????. ? ?

## 2021-09-27 NOTE — ED Triage Notes (Signed)
Pt reports headache and nausea x 2 days.   ? ?Stratus used for triage. ?

## 2021-11-06 ENCOUNTER — Encounter: Payer: Self-pay | Admitting: Family Medicine

## 2021-11-06 ENCOUNTER — Ambulatory Visit: Payer: Medicaid Other | Admitting: Family Medicine

## 2021-11-06 NOTE — Progress Notes (Signed)
Patient did not keep appointment today. She may call to reschedule.  

## 2022-12-02 IMAGING — CT CT HEAD W/O CM
1 of 2 series · 16 of 30 positions shown, 20 images · non-contrast
Comparison: None.

CLINICAL DATA: Syncope.  Migraines.

EXAM:
CT HEAD WITHOUT CONTRAST
TECHNIQUE: Contiguous axial images were obtained from the base of the skull
through the vertex without intravenous contrast.

[Series 4: head 2.0 h70h · axial · 0.41mm/px · z∈[-149,+5]mm · 16 of 87 slices shown, 20 images]
[im 5/87  brain]
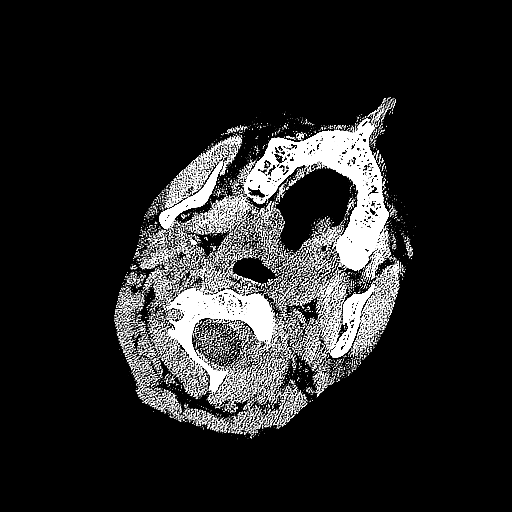
[im 5/87  bone]
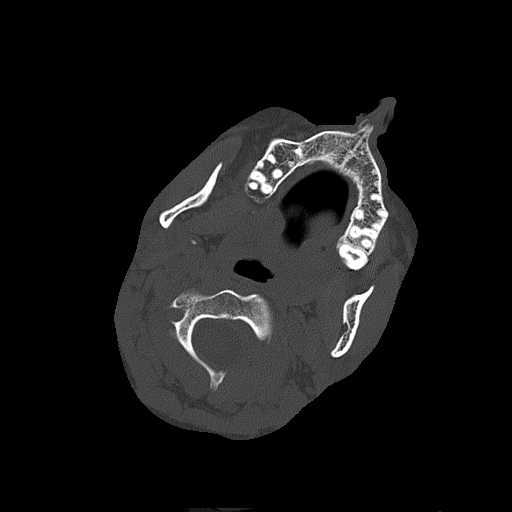
[im 9/87  brain]
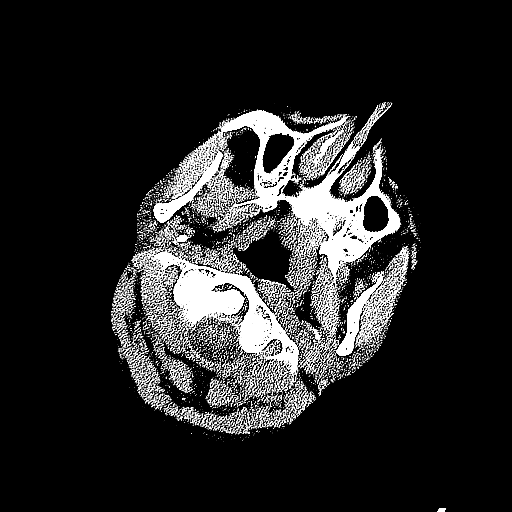
[im 13/87  brain]
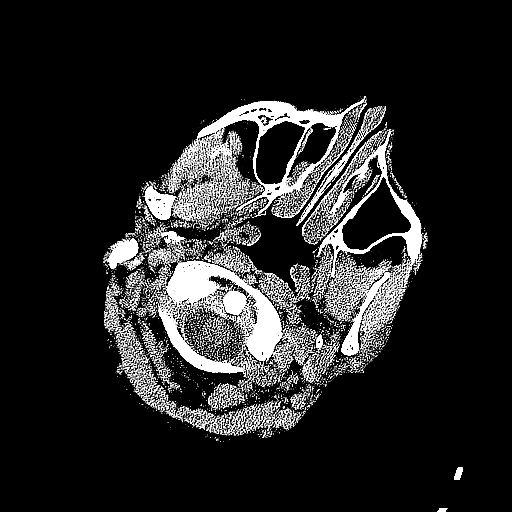
[im 22/87  brain]
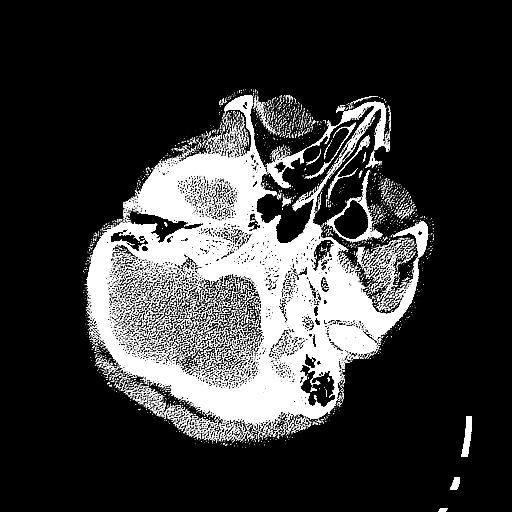
[im 26/87  brain]
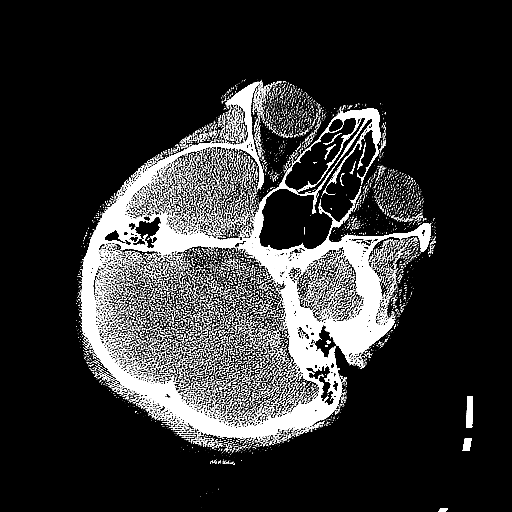
[im 26/87  bone]
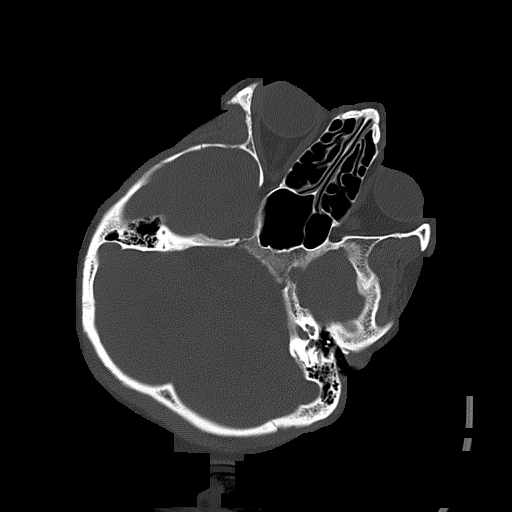
[im 31/87  brain]
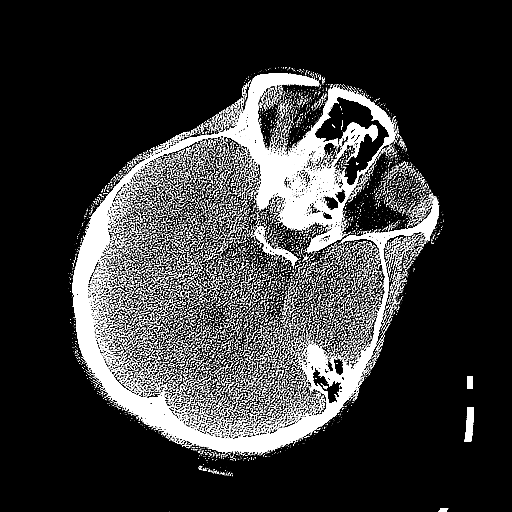
[im 35/87  brain]
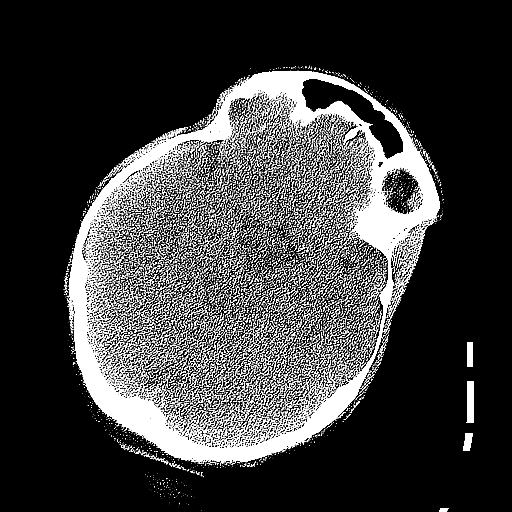
[im 39/87  brain]
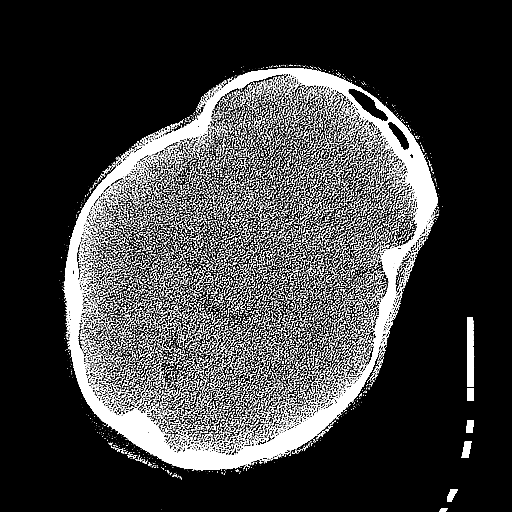
[im 48/87  brain]
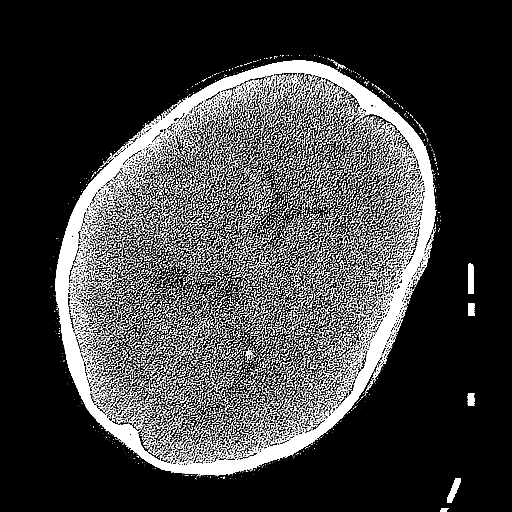
[im 48/87  bone]
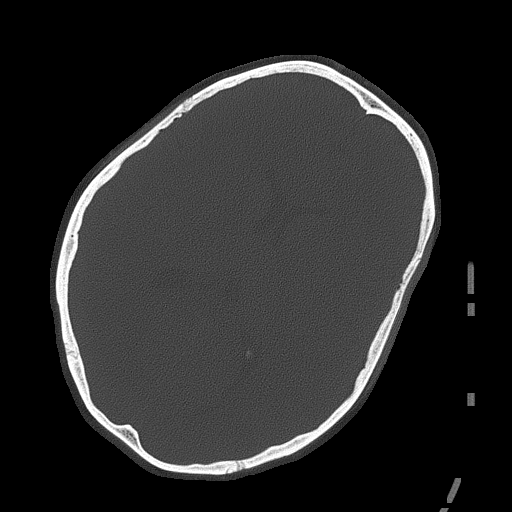
[im 52/87  brain]
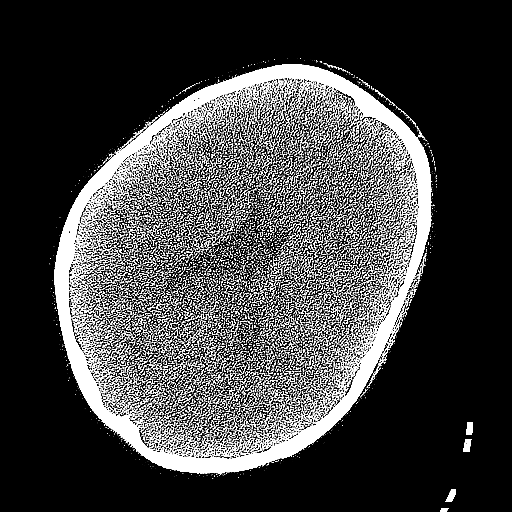
[im 56/87  brain]
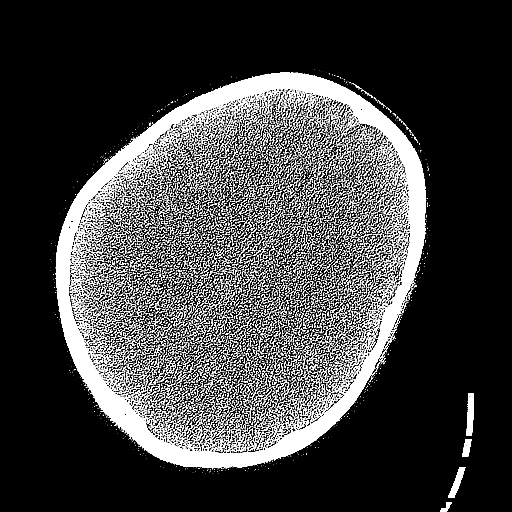
[im 61/87  brain]
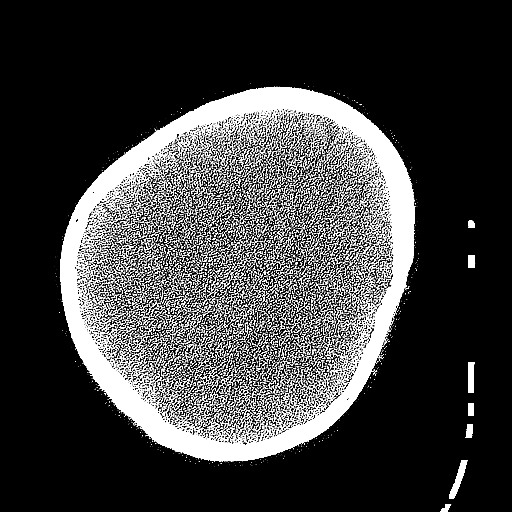
[im 65/87  brain]
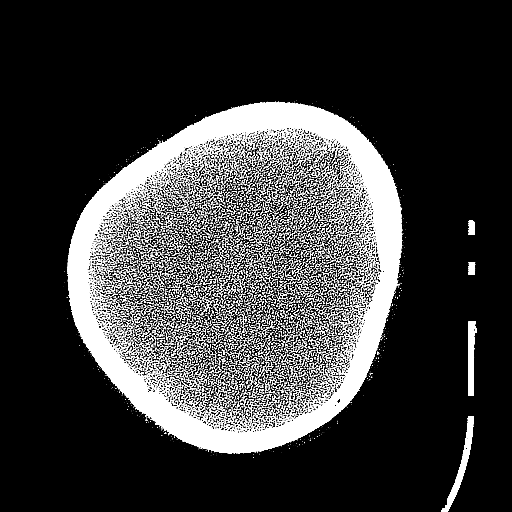
[im 65/87  bone]
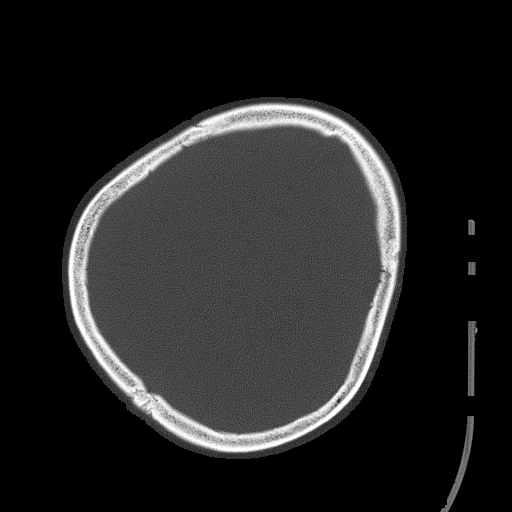
[im 74/87  brain]
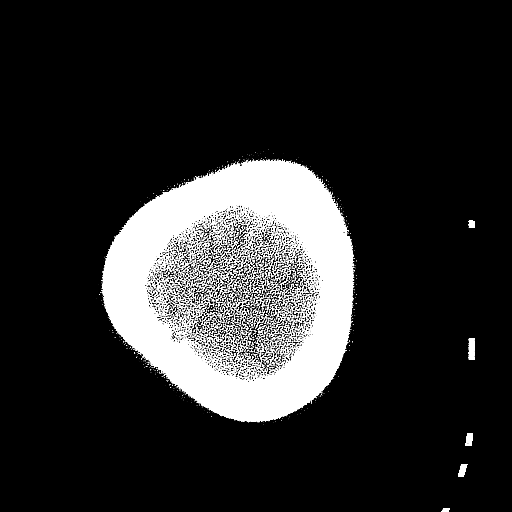
[im 78/87  brain]
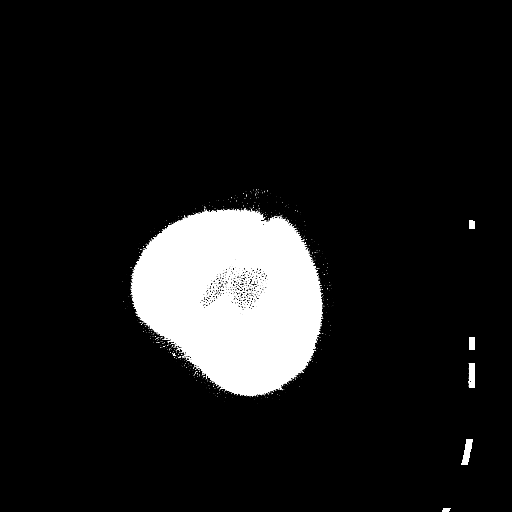
[im 82/87  brain]
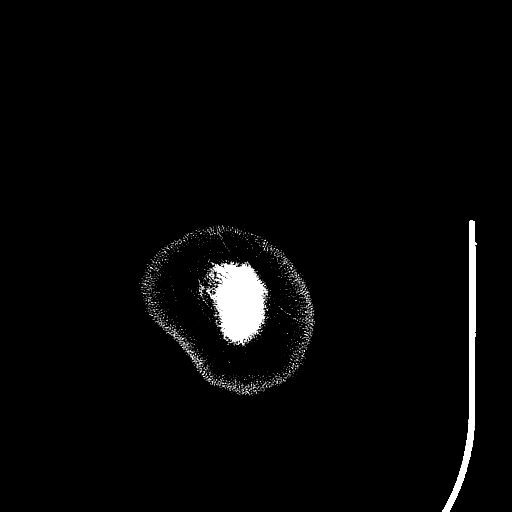

[16 of 30 positions shown; findings below may reference images not displayed]

FINDINGS: Brain: No evidence of acute large vascular territory infarction,
hemorrhage, hydrocephalus, extra-axial collection or mass
lesion/mass effect. Apparent hypodensity in the left temporal lobe
is favored to relate to streak artifact off the sphenoid and
temporal bones when correlating with coronal/sagittal imaging.

Vascular: No hyperdense vessel identified.

Skull: No acute fracture.

Sinuses/Orbits: Clear sinuses.  Unremarkable orbits.

Other: No mastoid effusions.
IMPRESSION: No evidence of acute intracranial abnormality.

## 2023-12-31 IMAGING — CT CT HEAD W/O CM
4 series · 15 of 47 positions shown, 17 images · non-contrast
Comparison: 08/29/2020

CLINICAL DATA: Severe headache



[Series 3: head wo · axial · 0.32mm/px · z∈[-561,-441]mm · 7 of 35 slices shown, 9 images]
[im 5/35  brain]
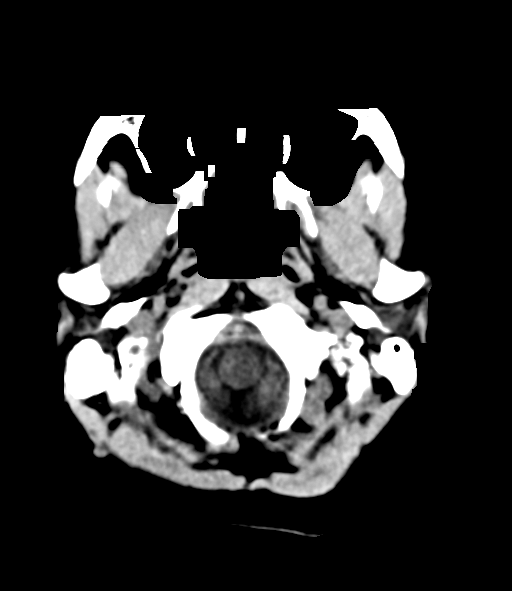
[im 5/35  bone]
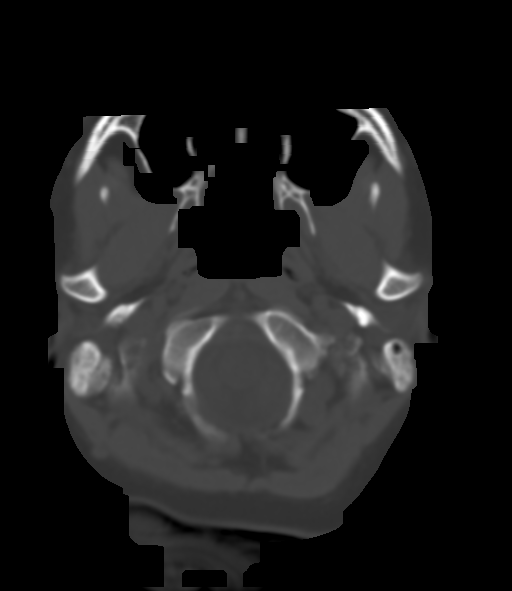
[im 9/35  brain]
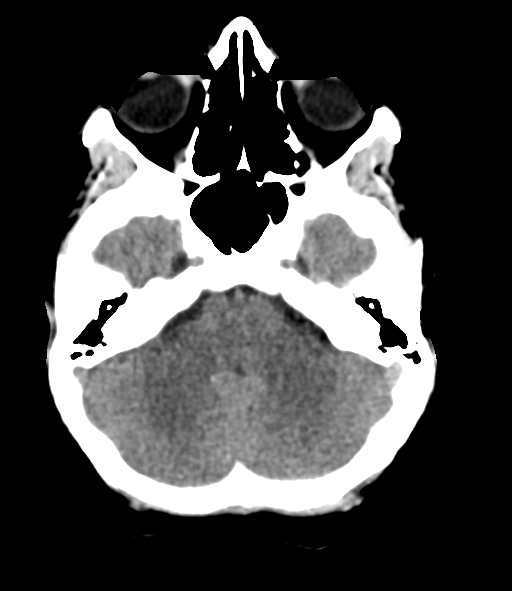
[im 13/35  brain]
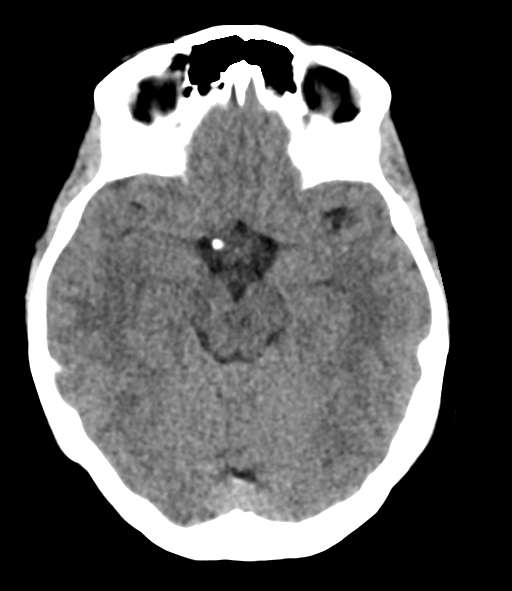
[im 18/35  brain]
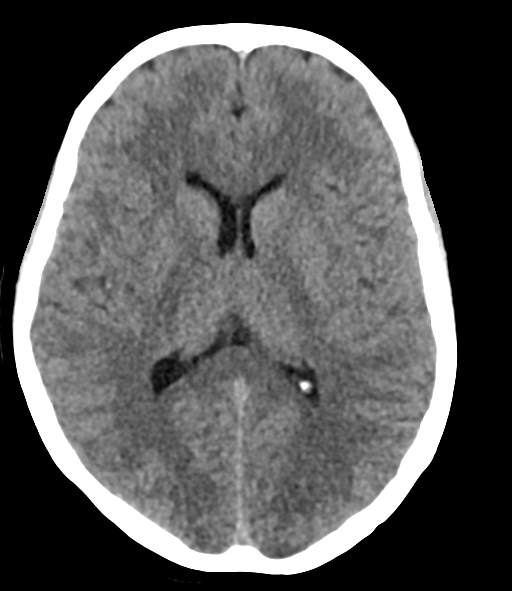
[im 22/35  brain]
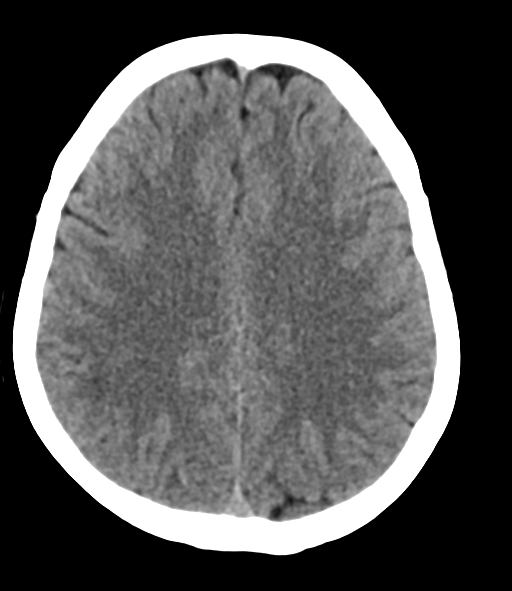
[im 22/35  bone]
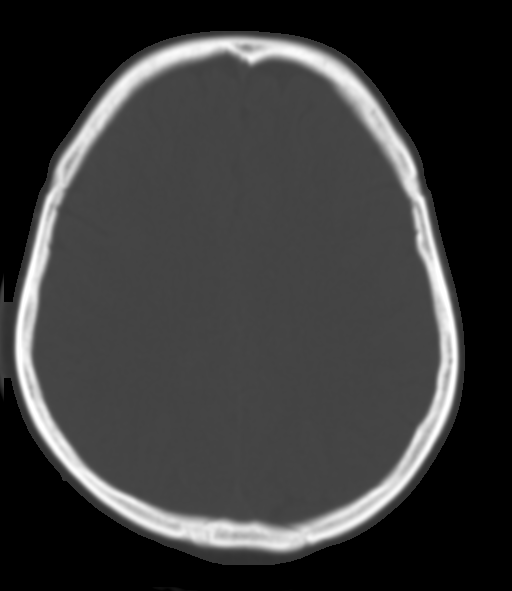
[im 26/35  brain]
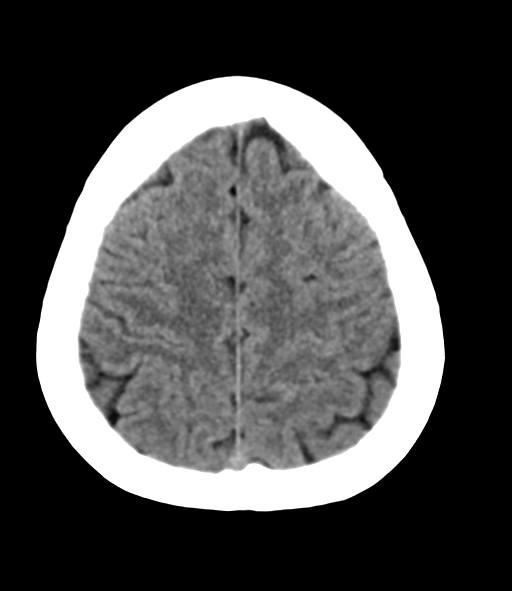
[im 30/35  brain]
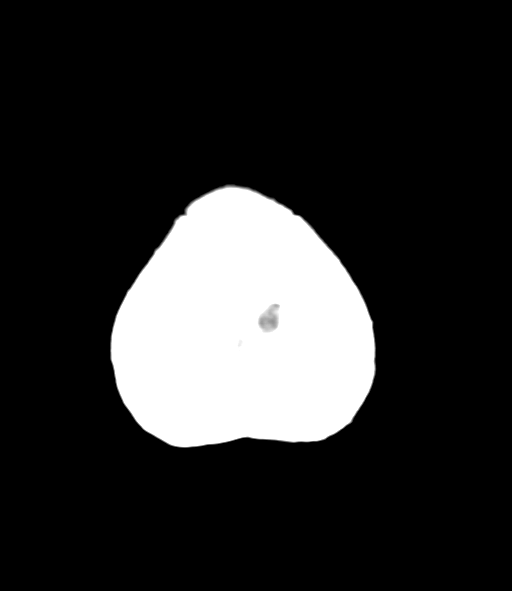

[Series 4: head bone · axial · 0.42mm/px · z∈[-532,-516]mm · 2 of 82 slices shown]
[im 9/82  bone]
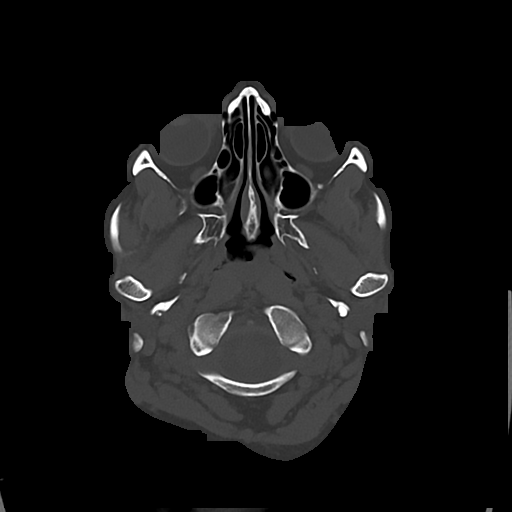
[im 17/82  bone]
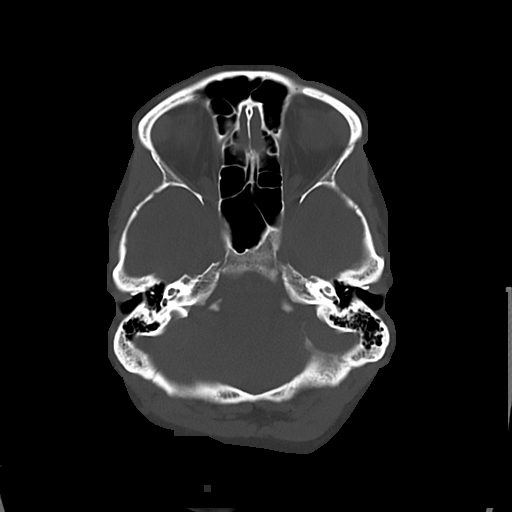

[Series 5: cor soft · coronal · 0.30mm/px · 3 of 65 slices shown]
[im 22/65  brain]
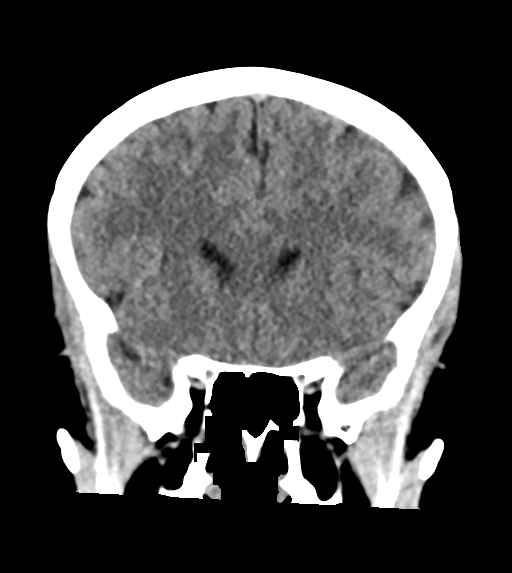
[im 29/65  brain]
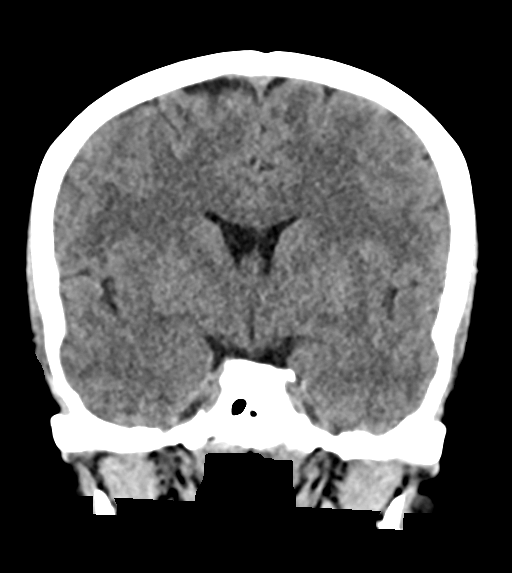
[im 36/65  brain]
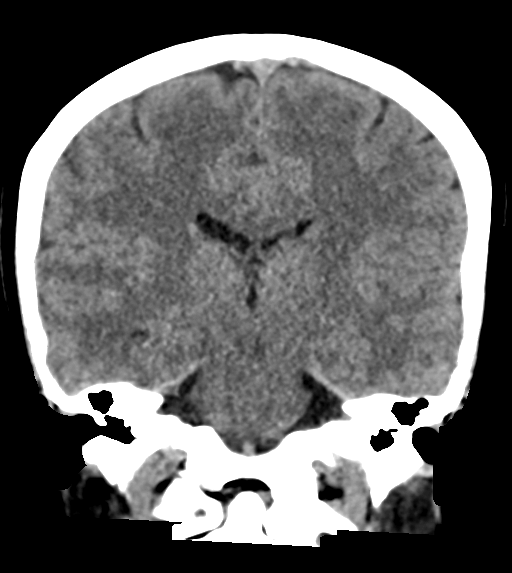

[Series 6: sag soft · sagittal · 0.33mm/px · 3 of 51 slices shown]
[im 17/51  brain]
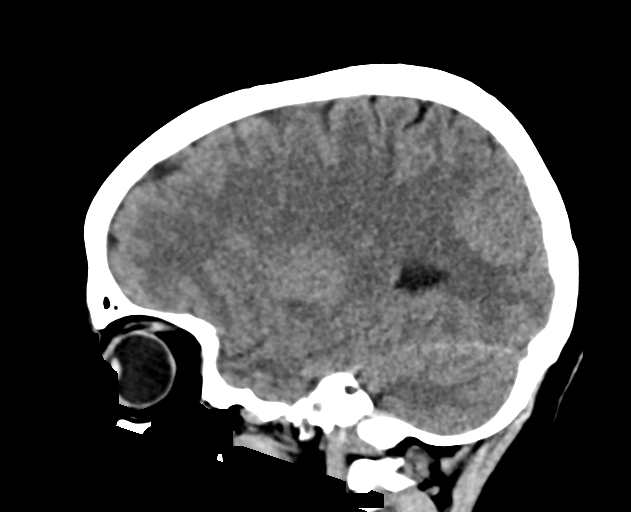
[im 26/51  brain]
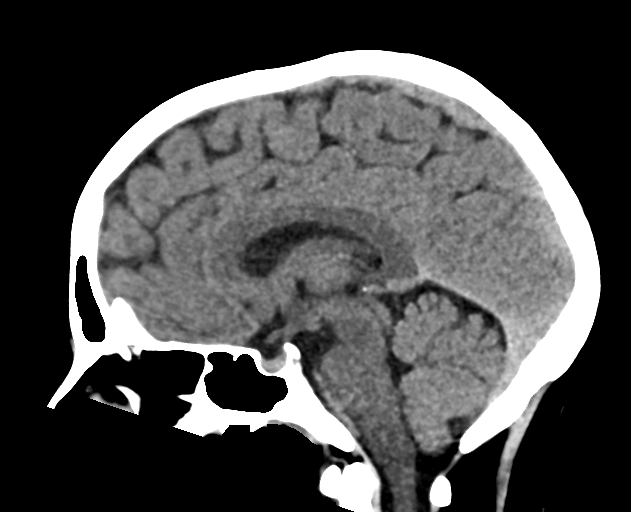
[im 34/51  brain]
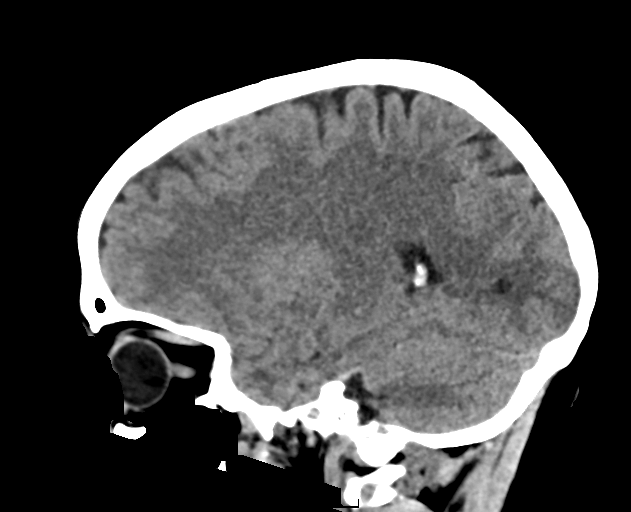

[15 of 47 positions shown; findings below may reference images not displayed]

FINDINGS: Brain: No evidence of acute infarction, hemorrhage, hydrocephalus,
extra-axial collection or mass lesion/mass effect.

Vascular: No hyperdense vessel or unexpected calcification.

Skull: Normal. Negative for fracture or focal lesion.

Sinuses/Orbits: No acute finding.

Other: None.
IMPRESSION: No acute intracranial pathology. No noncontrast CT findings to
explain headache.

## 2024-01-24 ENCOUNTER — Ambulatory Visit

## 2024-01-24 DIAGNOSIS — Z719 Counseling, unspecified: Secondary | ICD-10-CM

## 2024-01-24 DIAGNOSIS — Z23 Encounter for immunization: Secondary | ICD-10-CM

## 2024-01-24 NOTE — Progress Notes (Addendum)
 Patient seen in nurse clinic for immigration vaccines.  Review of documentation the patient provided - all vaccines in [facility].  Discussed vaccines needed.  Hep B, Varicella and Polio given and tolerated well. VIS provided. [facility] updated and 2 copies provided. Discussed return dates for vaccine shots. Interpreter - [employer] # [id number].  Patient was with husband and husband had several questions about [place] and questioned why all vaccines (for a series) could not be given today. Explained there is a schedule for vaccine administration and there are 2 to 3 shots required for some vaccines to have full protection and needed for immigration.   Patient's husband reported neither patient or him are able to read or write and language barrier.  Family needs appointment to complete [place].  Patient lives in [place]. Phone number for [facility] for [place] obtained and provided.
